# Patient Record
Sex: Male | Born: 1960
Health system: Southern US, Community
[De-identification: ages and names within clinical notes are randomized; demographics above are authoritative.]

## PROBLEM LIST (undated history)

## (undated) DIAGNOSIS — F419 Anxiety disorder, unspecified: Secondary | ICD-10-CM

## (undated) DIAGNOSIS — G4730 Sleep apnea, unspecified: Secondary | ICD-10-CM

## (undated) DIAGNOSIS — I10 Essential (primary) hypertension: Secondary | ICD-10-CM

## (undated) HISTORY — DX: Anxiety disorder, unspecified: F41.9

## (undated) HISTORY — PX: FOOT SURGERY: SHX648

## (undated) HISTORY — DX: Sleep apnea, unspecified: G47.30

## (undated) HISTORY — DX: Essential (primary) hypertension: I10

## (undated) HISTORY — PX: MOUTH SURGERY: SHX715

## (undated) HISTORY — PX: MANDIBLE FRACTURE SURGERY: SHX706

## (undated) HISTORY — PX: ANAL FISSURE REPAIR: SHX2312

---

## 2008-06-07 LAB — HM COLONOSCOPY: HM COLON: NORMAL

## 2009-04-26 ENCOUNTER — Encounter: Payer: Self-pay | Admitting: Cardiovascular Disease

## 2009-04-26 LAB — CONVERTED CEMR LAB
ALT: 48 units/L
BUN: 13 mg/dL
Chloride: 100 meq/L
Eosinophils Relative: 3 %
HDL: 53 mg/dL
Hemoglobin: 14 g/dL
Monocytes Relative: 8 %
RDW: 12.5 %
TSH: 2.7 microintl units/mL
Total Bilirubin: 0.5 mg/dL
Total Protein: 7.6 g/dL
Triglyceride fasting, serum: 107 mg/dL
WBC: 5.7 10*3/uL

## 2009-05-10 ENCOUNTER — Encounter: Payer: Self-pay | Admitting: Cardiovascular Disease

## 2009-05-10 DIAGNOSIS — F329 Major depressive disorder, single episode, unspecified: Secondary | ICD-10-CM | POA: Insufficient documentation

## 2009-05-10 DIAGNOSIS — F3289 Other specified depressive episodes: Secondary | ICD-10-CM | POA: Insufficient documentation

## 2009-05-10 DIAGNOSIS — E785 Hyperlipidemia, unspecified: Secondary | ICD-10-CM | POA: Insufficient documentation

## 2009-05-10 DIAGNOSIS — I10 Essential (primary) hypertension: Secondary | ICD-10-CM | POA: Insufficient documentation

## 2009-05-16 ENCOUNTER — Ambulatory Visit: Payer: Self-pay | Admitting: Cardiovascular Disease

## 2009-05-16 ENCOUNTER — Encounter: Payer: Self-pay | Admitting: Cardiovascular Disease

## 2009-05-17 ENCOUNTER — Encounter: Payer: Self-pay | Admitting: Cardiovascular Disease

## 2009-06-10 ENCOUNTER — Ambulatory Visit: Payer: Self-pay | Admitting: Internal Medicine

## 2009-06-10 ENCOUNTER — Ambulatory Visit: Payer: Self-pay

## 2009-06-10 ENCOUNTER — Encounter (INDEPENDENT_AMBULATORY_CARE_PROVIDER_SITE_OTHER): Payer: Self-pay | Admitting: *Deleted

## 2009-06-10 ENCOUNTER — Ambulatory Visit (HOSPITAL_COMMUNITY): Admission: RE | Admit: 2009-06-10 | Discharge: 2009-06-10 | Payer: Self-pay | Admitting: Cardiovascular Disease

## 2009-06-22 ENCOUNTER — Ambulatory Visit: Payer: Self-pay | Admitting: Cardiovascular Disease

## 2010-03-29 NOTE — Assessment & Plan Note (Signed)
Summary: NP6/AMD   Visit Type:  New Patient Referring Provider:  Julieanne Manson Primary Provider:  Julieanne Manson  CC:  no complaints and to be seen due to family history-dad had first mi around 55ish. .  History of Present Illness: 50 yo WM with history of HTN, hyperlipidemia and anxiety who is referred today for initial cardiac evaluation secondary to strong family history of CAD. He has had no chest pain, SOB, palpitations, fatigue, near syncope, syncope, orthopnea, PND or lower extremity edema. He does not smoke. He exercises 3-4 days per week and plays tennis and basketball.   Old records reviewed. Fasting lipids (04/26/09): TC: 204  HDL: 38  LDL: 131  TG:101.  Preventive Screening-Counseling & Management  Alcohol-Tobacco     Alcohol drinks/day: <1     Smoking Status: never  Caffeine-Diet-Exercise     Caffeine use/day: 2     Does Patient Exercise: yes      Drug Use:  no.    Current Problems (verified): 1)  Depression, Mild  (ICD-311) 2)  Hyperlipidemia-mixed  (ICD-272.4) 3)  Hypertension, Benign  (ICD-401.1)  Current Medications (verified): 1)  Crestor 5 Mg Tabs (Rosuvastatin Calcium) .Marland Kitchen.. 1 By Mouth Four Times A Week 2)  Aspirin 81 Mg Tbec (Aspirin) .... Take One Tablet By Mouth Daily 3)  Vitamin C Cr 500 Mg Cr-Tabs (Ascorbic Acid) .Marland Kitchen.. 1 By Mouth Once Daily 4)  Ergocalciferol 50000 Unit Caps (Ergocalciferol) .Marland Kitchen.. 1 By Mouth Week 5)  Effexor Xr 75 Mg Xr24h-Cap (Venlafaxine Hcl) .Marland Kitchen.. 1po Once Daily 6)  Coq10 100 Mg Caps (Coenzyme Q10) .Marland Kitchen.. 1 By Mouth Once Daily 7)  Fish Oil 1000 Mg Caps (Omega-3 Fatty Acids) .Marland Kitchen.. 1 By Mouth Daily As Thinking About It  Allergies (verified): No Known Drug Allergies  Past History:  Past Medical History: Hyperlipidemia HTN Depression/Anxiety  Past Surgical History: Plastic surgery on jaw-trauma as child Anal fissure repaired. 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  Alcohol Use - yes, social. Regular Exercise - yes Drug Use - no No illicit drug use Alcohol drinks/day:  <1 Smoking Status:  never Caffeine use/day:  2 Does Patient Exercise:  yes Drug Use:  no  Review of Systems  The patient denies fatigue, malaise, fever, weight gain/loss, vision loss, decreased hearing, hoarseness, chest pain, palpitations, shortness of breath, prolonged cough, wheezing, sleep apnea, coughing up blood, abdominal pain, blood in stool, nausea, vomiting, diarrhea, heartburn, incontinence, blood in urine, muscle weakness, joint pain, leg swelling, rash, skin lesions, headache, fainting, dizziness, depression, anxiety, enlarged lymph nodes, easy bruising or bleeding, and environmental allergies.    Vital Signs:  Patient profile:   50 year old male Height:      75 inches Weight:      211.75 pounds BMI:     26.56 Pulse rate:   65 / minute Pulse rhythm:   regular BP sitting:   140 / 90  (left arm) Cuff size:   large  Vitals Entered By: Mercer Pod (May 16, 2009 2:20 PM)  Physical Exam  General:  General: Well developed, well nourished, NAD HEENT: OP clear, mucus membranes moist SKIN: warm, dry Neuro: No focal deficits Musculoskeletal: Muscle strength 5/5 all ext Psychiatric: Mood and affect normal Neck: No JVD, no carotid bruits, no thyromegaly, no  lymphadenopathy. Lungs:Clear bilaterally, no wheezes, rhonci, crackles CV: RRR no murmurs, gallops rubs Abdomen: soft, NT, ND, BS present Extremities: No edema, pulses 2+.    EKG  Procedure date:  04/26/2009  Findings:      Sinus bradycardia, rate 59 bpm.   Impression & Recommendations:  Problem # 1:  FAMILY HISTORY OF ISCHEMIC HEART DISEASE (ICD-V17.3) Mr. Palladino has risk factors for CAD including HTN and hyperlipidemia. He has had no  exertional chest pain or SOB, however, given his strong family history of CAD, he is concerned about the potential for CAD. I will arrange a stress echo for risk stratification. He understands the importance of balanced, low fat diet, as well as the benefit of daily exercise and weight maintenance. He is a lifelong non-smoker.   Orders: Stress Echo (Stress Echo)  Problem # 2:  HYPERTENSION, BENIGN (ICD-401.1) Borderline elevation. He does not wish to start medical therapy at this time. He will continue to follow at home.   His updated medication list for this problem includes:    Aspirin 81 Mg Tbec (Aspirin) .Marland Kitchen... Take one tablet by mouth daily  Problem # 3:  HYPERLIPIDEMIA-MIXED (ICD-272.4) Continue statin per Dr. Sullivan Lone.   His updated medication list for this problem includes:    Crestor 5 Mg Tabs (Rosuvastatin calcium) .Marland Kitchen... 1 by mouth four times a week  Patient Instructions: 1)  Your physician recommends that you schedule a follow-up appointment in: 2-3 weeks 2)  Your physician recommends that you continue on your current medications as directed. Please refer to the Current Medication list given to you today. 3)  Your physician has requested that you have a stress echocardiogram. For further information please visit [email or web address]  Please follow instruction sheet as given.

## 2010-03-29 NOTE — Assessment & Plan Note (Signed)
Summary: ROV/AMD   Visit Type:  Follow-up Referring Provider:  Julieanne Manson Primary Provider:  Julieanne Manson  CC:  Patient do not have any complaint today. Patient is here for a follow up.  History of Present Illness: 50 yo WM with history of HTN, hyperlipidemia and anxiety who is here today for follow up. He was seen last month for  initial cardiac evaluation secondary to strong family history of CAD. He has had no chest pain, SOB, palpitations, fatigue, near syncope, syncope, orthopnea, PND or lower extremity edema. He does not smoke. He exercises 3-4 days per week and plays tennis and basketball.   I ordered a stress echo for screening. His LV function was normal and increased with exercise. No EKG changes or signs of ischemia. Target heart rate was achieved. He is asymptomatic.   Current Medications (verified): 1)  Crestor 5 Mg Tabs (Rosuvastatin Calcium) .Marland Kitchen.. 1 By Mouth Four Times A Week 2)  Aspirin 81 Mg Tbec (Aspirin) .... Take One Tablet By Mouth Daily 3)  Vitamin C Cr 500 Mg Cr-Tabs (Ascorbic Acid) .Marland Kitchen.. 1 By Mouth Once Daily 4)  Ergocalciferol 50000 Unit Caps (Ergocalciferol) .Marland Kitchen.. 1 By Mouth Week 5)  Effexor Xr 75 Mg Xr24h-Cap (Venlafaxine Hcl) .Marland Kitchen.. 1po Once Daily 6)  Coq10 100 Mg Caps (Coenzyme Q10) .Marland Kitchen.. 1 By Mouth Once Daily 7)  Fish Oil 1000 Mg Caps (Omega-3 Fatty Acids) .Marland Kitchen.. 1 By Mouth Daily As Thinking About It  Allergies (verified): No Known Drug Allergies  Past History:  Past Medical History: Reviewed history from 05/16/2009 and no changes required. Hyperlipidemia HTN Depression/Anxiety  Social History: Reviewed history from 05/16/2009 and no changes required. Full Time, delivery manager at [employer] Single, no children Tobacco Use - No.  Alcohol Use - yes, social. Regular Exercise - yes Drug Use - no No illicit drug use  Review of Systems  The patient denies fatigue, malaise, fever, weight gain/loss, vision loss, decreased hearing, hoarseness, chest pain,  palpitations, shortness of breath, prolonged cough, wheezing, sleep apnea, coughing up blood, abdominal pain, blood in stool, nausea, vomiting, diarrhea, heartburn, incontinence, blood in urine, muscle weakness, joint pain, leg swelling, rash, skin lesions, headache, fainting, dizziness, depression, anxiety, enlarged lymph nodes, easy bruising or bleeding, and environmental allergies.    Vital Signs:  Patient profile:   50 year old male Height:      75 inches Weight:      2210.75 pounds Pulse rate:   72 / minute BP sitting:   138 / 70  (left arm) Cuff size:   large  Physical Exam  General:  General: Well developed, well nourished, NAD Psychiatric: Mood and affect normal Neck: No JVD, no carotid bruits, no thyromegaly, no lymphadenopathy. Lungs:Clear bilaterally, no wheezes, rhonci, crackles CV: RRR no murmurs, gallops rubs Abdomen: soft, NT, ND, BS present Extremities: No edema, pulses 2+.    Stress Echocardiogram  Procedure date:  06/10/2009  Findings:      Study Conclusions  - Stress ECG conclusions: The stress ECG was normal. - Staged echo: Normal echo stress  Pt achieved target heart rate. No wall motion abnormalities with exercise. No eKG changes.   EKG  Procedure date:  06/22/2009  Findings:      NSR, Normal ekg  Impression & Recommendations:  Problem # 1:  FAMILY HISTORY OF ISCHEMIC HEART DISEASE (ICD-V17.3) NO evidence of ischemia on stress testing. No further cardiac workup at this time. Follow up as needed.   Patient Instructions: 1)  Your physician recommends that  you schedule a follow-up appointment in: as needed.

## 2010-03-29 NOTE — Progress Notes (Signed)
Summary: PHI  PHI   Imported By: Harlon Flor 05/18/2009 09:41:14  _____________________________________________________________________  External Attachment:    Type:   Image     Comment:   External Document

## 2010-03-29 NOTE — Letter (Signed)
Summary: Outpatient Coinsurance Notice  Outpatient Coinsurance Notice   Imported By: Marylou Mccoy 07/04/2009 11:03:48  _____________________________________________________________________  External Attachment:    Type:   Image     Comment:   External Document

## 2012-05-21 ENCOUNTER — Ambulatory Visit: Payer: Self-pay | Admitting: Family Medicine

## 2012-12-16 ENCOUNTER — Ambulatory Visit (INDEPENDENT_AMBULATORY_CARE_PROVIDER_SITE_OTHER): Payer: BC Managed Care – PPO

## 2012-12-16 ENCOUNTER — Ambulatory Visit (INDEPENDENT_AMBULATORY_CARE_PROVIDER_SITE_OTHER): Payer: BC Managed Care – PPO | Admitting: Podiatry

## 2012-12-16 ENCOUNTER — Encounter: Payer: Self-pay | Admitting: Podiatry

## 2012-12-16 VITALS — BP 119/78 | HR 69 | Resp 16 | Ht 75.0 in | Wt 204.0 lb

## 2012-12-16 DIAGNOSIS — M21619 Bunion of unspecified foot: Secondary | ICD-10-CM

## 2012-12-16 DIAGNOSIS — M2020 Hallux rigidus, unspecified foot: Secondary | ICD-10-CM

## 2012-12-16 DIAGNOSIS — M21612 Bunion of left foot: Secondary | ICD-10-CM

## 2012-12-16 DIAGNOSIS — M779 Enthesopathy, unspecified: Secondary | ICD-10-CM

## 2012-12-16 NOTE — Progress Notes (Signed)
Subjective:     Patient ID: Zachary Hess, male   DOB: 07-04-1960, 52 y.o.   MRN: [id number]  HPI patient presents stating my left foot feels good I am playing tennis and starting to notice some discomfort in my right big toe joint   Review of Systems  All other systems reviewed and are negative.       Objective:   Physical Exam  Constitutional: He appears well-developed and well-nourished.  Cardiovascular: Intact distal pulses.   Musculoskeletal: Normal range of motion.  Neurological: He is alert.  Skin: Skin is warm.   patient has reduced range of motion first MPJ right with mild crepitus upon dorsiflexion of the hallux and a well healed surgical site left first metatarsal     Assessment:     Implant surgery left doing beautifully with hallux limitus with mild capsulitis first right MPJ    Plan:     Reviewed x-rays of left foot and discussed correction of the right foot. I am hopeful that we can catch it early we could reconstruct it versus destruct and I explained to him the differences patient may resume normal activity for left foot at the current time

## 2014-03-05 ENCOUNTER — Encounter: Payer: Self-pay | Admitting: Cardiovascular Disease

## 2014-05-31 LAB — LIPID PANEL
CHOLESTEROL: 179 mg/dL (ref 0–200)
HDL: 57 mg/dL (ref 35–70)
LDL Cholesterol: 96 mg/dL
LDL/HDL RATIO: 1.7
TRIGLYCERIDES: 131 mg/dL (ref 40–160)

## 2014-05-31 LAB — CBC AND DIFFERENTIAL
HEMATOCRIT: 39 % — AB (ref 41–53)
HEMOGLOBIN: 13.3 g/dL — AB (ref 13.5–17.5)
NEUTROS ABS: 3 /uL
Platelets: 235 10*3/uL (ref 150–399)
WBC: 5.2 10^3/mL

## 2014-05-31 LAB — BASIC METABOLIC PANEL
BUN: 18 mg/dL (ref 4–21)
Creatinine: 0.9 mg/dL (ref 0.6–1.3)
Glucose: 103 mg/dL
POTASSIUM: 4.5 mmol/L (ref 3.4–5.3)
SODIUM: 139 mmol/L (ref 137–147)

## 2014-05-31 LAB — HEPATIC FUNCTION PANEL
ALT: 31 U/L (ref 10–40)
AST: 28 U/L (ref 14–40)
Alkaline Phosphatase: 73 U/L (ref 25–125)

## 2014-05-31 LAB — TSH: TSH: 4.13 u[IU]/mL (ref 0.41–5.90)

## 2014-11-29 ENCOUNTER — Encounter: Payer: Self-pay | Admitting: Family Medicine

## 2014-11-29 ENCOUNTER — Ambulatory Visit (INDEPENDENT_AMBULATORY_CARE_PROVIDER_SITE_OTHER): Payer: BLUE CROSS/BLUE SHIELD | Admitting: Family Medicine

## 2014-11-29 VITALS — BP 138/82 | HR 64 | Temp 98.0°F | Resp 12 | Ht 74.5 in | Wt 211.0 lb

## 2014-11-29 DIAGNOSIS — Z23 Encounter for immunization: Secondary | ICD-10-CM | POA: Diagnosis not present

## 2014-11-29 DIAGNOSIS — Z6826 Body mass index (BMI) 26.0-26.9, adult: Secondary | ICD-10-CM | POA: Insufficient documentation

## 2014-11-29 DIAGNOSIS — I10 Essential (primary) hypertension: Secondary | ICD-10-CM | POA: Insufficient documentation

## 2014-11-29 DIAGNOSIS — E559 Vitamin D deficiency, unspecified: Secondary | ICD-10-CM | POA: Insufficient documentation

## 2014-11-29 DIAGNOSIS — J302 Other seasonal allergic rhinitis: Secondary | ICD-10-CM | POA: Insufficient documentation

## 2014-11-29 DIAGNOSIS — F4320 Adjustment disorder, unspecified: Secondary | ICD-10-CM | POA: Insufficient documentation

## 2014-11-29 DIAGNOSIS — F329 Major depressive disorder, single episode, unspecified: Secondary | ICD-10-CM | POA: Insufficient documentation

## 2014-11-29 DIAGNOSIS — F32A Depression, unspecified: Secondary | ICD-10-CM | POA: Insufficient documentation

## 2014-11-29 DIAGNOSIS — F320 Major depressive disorder, single episode, mild: Secondary | ICD-10-CM | POA: Insufficient documentation

## 2014-11-29 DIAGNOSIS — E785 Hyperlipidemia, unspecified: Secondary | ICD-10-CM | POA: Insufficient documentation

## 2014-11-29 NOTE — Progress Notes (Signed)
Patient ID: Zachary Hess, male   DOB: 13-May-1960, 54 y.o.   MRN: [id number]    Subjective:  HPI  Hypertension follow up: Patient is here for 6 months follow up. Patient is tolerating medication well. He checks his B/P sometimes and readings are around 124/78. BP Readings from Last 3 Encounters:  11/29/14 138/82  05/31/14 118/76  12/16/12 119/78   Depression follow up: Patient states he is doing ok. He is taking Effexor 37.5 mg.  PHQ9 today is 2.  Hyperlipidemia follow up: Patient is on Lipitor for this. Patient is tolerating it well. Lab Results  Component Value Date   CHOL 179 05/31/2014   CHOL 204 04/26/2009   Lab Results  Component Value Date   HDL 57 05/31/2014   HDL 53 04/26/2009   Lab Results  Component Value Date   LDLCALC 96 05/31/2014   LDLCALC 131 04/26/2009   Lab Results  Component Value Date   TRIG 131 05/31/2014   TRIG 107 04/26/2009       Prior to Admission medications   Medication Sig Start Date End Date Taking? Authorizing Provider  ALPRAZolam Duanne Moron) 0.5 MG tablet Take by mouth. 02/23/13   Historical Provider, MD  ascorbic acid (VITAMIN C) 500 MG tablet Take by mouth. 05/11/10   Historical Provider, MD  aspirin 81 MG tablet Take by mouth.    Historical Provider, MD  atorvastatin (LIPITOR) 10 MG tablet Take by mouth. 06/24/14   Historical Provider, MD  atorvastatin (LIPITOR) 10 MG tablet  11/07/14   Historical Provider, MD  Cholecalciferol 1000 UNITS tablet Take by mouth.    Historical Provider, MD  Coenzyme Q10 (COQ10) 100 MG CAPS Take by mouth. 05/11/10   Historical Provider, MD  fluticasone (FLONASE) 50 MCG/ACT nasal spray Place into the nose.    Historical Provider, MD  Loratadine (CLARITIN PO) Take by mouth daily.    Historical Provider, MD  losartan (COZAAR) 50 MG tablet Take 50 mg by mouth daily.    Historical Provider, MD  Lysine HCl 1000 MG TABS Take by mouth. 05/11/10   Historical Provider, MD  venlafaxine (EFFEXOR) 37.5 MG tablet Take 37.5  mg by mouth daily.    Historical Provider, MD    Patient Active Problem List   Diagnosis Date Noted  . Adaptation reaction 11/29/2014  . Body mass index (BMI) of 26.0-26.9 in adult 11/29/2014  . Essential (primary) hypertension 11/29/2014  . HLD (hyperlipidemia) 11/29/2014  . Mild depression 11/29/2014  . Clinical depression 11/29/2014  . Allergic rhinitis, seasonal 11/29/2014  . Avitaminosis D 11/29/2014  . HYPERLIPIDEMIA-MIXED 05/10/2009  . DEPRESSION, MILD 05/10/2009  . HYPERTENSION, BENIGN 05/10/2009    Past Medical History  Diagnosis Date  . HBP (high blood pressure)   . Anxiety     Social History   Social History  . Marital Status: Married    Spouse Name: N/A  . Number of Children: N/A  . Years of Education: N/A   Occupational History  . Not on file.   Social History Main Topics  . Smoking status: Never Smoker   . Smokeless tobacco: Never Used  . Alcohol Use: Yes     Comment: SOCIALLY  . Drug Use: No  . Sexual Activity: Not on file   Other Topics Concern  . Not on file   Social History Narrative    No Known Allergies  Review of Systems  Constitutional: Negative.   Respiratory: Negative.   Cardiovascular: Negative.   Gastrointestinal: Negative.   Musculoskeletal: Negative.  Neurological: Negative.   Endo/Heme/Allergies: Negative.   Psychiatric/Behavioral: Negative.     Immunization History  Administered Date(s) Administered  . Hepatitis A 01/24/2011, 03/13/2011, 07/24/2011  . Hepatitis B 01/24/2011, 03/13/2011, 07/24/2011  . Td 10/04/2003  . Tdap 11/25/2013  . Typhoid Inactivated 03/11/2011   Objective:  BP 138/82 mmHg  Pulse 64  Temp(Src) 98 F (36.7 C)  Resp 12  Ht 6' 2.5" (1.892 m)  Wt 211 lb (95.709 kg)  BMI 26.74 kg/m2  Physical Exam  Constitutional: He is oriented to person, place, and time and well-developed, well-nourished, and in no distress.  HENT:  Head: Normocephalic and atraumatic.  Right Ear: External ear normal.    Left Ear: External ear normal.  Nose: Nose normal.  Eyes: Conjunctivae are normal.  Neck: Neck supple.  Cardiovascular: Normal rate, regular rhythm and normal heart sounds.   Pulmonary/Chest: Effort normal and breath sounds normal.  Abdominal: Soft.  Neurological: He is alert and oriented to person, place, and time.  Skin: Skin is warm and dry.  Psychiatric: Mood, memory, affect and judgment normal.    Lab Results  Component Value Date   WBC 5.2 05/31/2014   HGB 13.3* 05/31/2014   HCT 39* 05/31/2014   PLT 235 05/31/2014   GLUCOSE 96 04/26/2009   CHOL 179 05/31/2014   TRIG 131 05/31/2014   HDL 57 05/31/2014   LDLCALC 96 05/31/2014   TSH 4.13 05/31/2014    CMP     Component Value Date/Time   NA 139 05/31/2014   NA 139 04/26/2009   K 4.5 05/31/2014   CL 100 04/26/2009   CO2 28 04/26/2009   GLUCOSE 96 04/26/2009   BUN 18 05/31/2014   BUN 13 04/26/2009   CREATININE 0.9 05/31/2014   CREATININE 1.03 04/26/2009   CALCIUM 9.6 04/26/2009   PROT 7.6 04/26/2009   AST 28 05/31/2014   ALT 31 05/31/2014   ALKPHOS 73 05/31/2014   BILITOT 0.5 04/26/2009    Assessment and Plan :  1. Need for influenza vaccination  - Flu Vaccine QUAD 36+ mos IM 2. Hypertension 3. Hyperlipidemia 4. Depression Plan to wean venlafaxine next spring  [facility] 11/29/2014 2:34 PM

## 2015-03-02 ENCOUNTER — Other Ambulatory Visit: Payer: Self-pay | Admitting: Family Medicine

## 2015-05-02 ENCOUNTER — Other Ambulatory Visit: Payer: Self-pay | Admitting: Family Medicine

## 2015-06-02 ENCOUNTER — Ambulatory Visit (INDEPENDENT_AMBULATORY_CARE_PROVIDER_SITE_OTHER): Payer: BLUE CROSS/BLUE SHIELD | Admitting: Family Medicine

## 2015-06-02 ENCOUNTER — Telehealth: Payer: Self-pay | Admitting: Family Medicine

## 2015-06-02 ENCOUNTER — Other Ambulatory Visit: Payer: Self-pay | Admitting: Family Medicine

## 2015-06-02 VITALS — BP 114/72 | HR 60 | Temp 98.0°F | Resp 16 | Ht 74.5 in | Wt 213.0 lb

## 2015-06-02 DIAGNOSIS — Z0000 Encounter for general adult medical examination without abnormal findings: Secondary | ICD-10-CM | POA: Diagnosis not present

## 2015-06-02 DIAGNOSIS — Z1211 Encounter for screening for malignant neoplasm of colon: Secondary | ICD-10-CM | POA: Diagnosis not present

## 2015-06-02 DIAGNOSIS — E785 Hyperlipidemia, unspecified: Secondary | ICD-10-CM

## 2015-06-02 DIAGNOSIS — I10 Essential (primary) hypertension: Secondary | ICD-10-CM | POA: Diagnosis not present

## 2015-06-02 DIAGNOSIS — Z125 Encounter for screening for malignant neoplasm of prostate: Secondary | ICD-10-CM | POA: Diagnosis not present

## 2015-06-02 LAB — POCT URINALYSIS DIPSTICK
Bilirubin, UA: NEGATIVE
Blood, UA: NEGATIVE
Glucose, UA: NEGATIVE
KETONES UA: NEGATIVE
LEUKOCYTES UA: NEGATIVE
NITRITE UA: NEGATIVE
PROTEIN UA: NEGATIVE
Spec Grav, UA: <=1.005
Urobilinogen, UA: NEGATIVE
pH, UA: 5

## 2015-06-02 LAB — IFOBT (OCCULT BLOOD): IFOBT: NEGATIVE

## 2015-06-02 NOTE — Telephone Encounter (Signed)
Cardiac risk stratification, hyperlipidemia, hypertension

## 2015-06-02 NOTE — Telephone Encounter (Signed)
Per [facility] test for cardiac scoring should be [id number]

## 2015-06-02 NOTE — Telephone Encounter (Signed)
What diagnoses are we using for the test?-aa

## 2015-06-02 NOTE — Telephone Encounter (Signed)
Ok thx.

## 2015-06-02 NOTE — Telephone Encounter (Signed)
Done-aa 

## 2015-06-02 NOTE — Progress Notes (Signed)
Patient ID: Zachary Hess, male   DOB: 21-Oct-1960, 55 y.o.   MRN: [id number] Patient: Zachary Hess, Male    DOB: 1960/04/02, 55 y.o.   MRN: [id number] Visit Date: 06/02/2015  Today's Provider: [name], MD   Chief Complaint  Patient presents with  . Annual Exam   Subjective:  Zachary Hess is a 55 y.o. male who presents today for health maintenance and complete physical. He feels well. He reports exercising 4 days per week. He reports he is sleeping well.  Immunization History  Administered Date(s) Administered  . Hepatitis A 01/24/2011, 03/13/2011, 07/24/2011  . Hepatitis B 01/24/2011, 03/13/2011, 07/24/2011  . Influenza,inj,Quad PF,36+ Mos 11/29/2014  . Td 10/04/2003  . Tdap 11/25/2013  . Typhoid Inactivated 03/11/2011   06/07/2008 Colonoscopy   Review of Systems  Constitutional: Positive for fatigue.  HENT: Positive for sneezing.   Eyes: Negative.   Respiratory: Negative.   Cardiovascular: Negative.   Gastrointestinal: Negative.   Endocrine: Negative.   Genitourinary: Negative.   Musculoskeletal: Positive for arthralgias.  Skin: Negative.   Allergic/Immunologic: Positive for environmental allergies.  Neurological: Negative.   Hematological: Negative.   Psychiatric/Behavioral: Positive for decreased concentration.    Social History   Social History  . Marital Status: Married    Spouse Name: N/A  . Number of Children: N/A  . Years of Education: N/A   Occupational History  . Not on file.   Social History Main Topics  . Smoking status: Never Smoker   . Smokeless tobacco: Never Used  . Alcohol Use: Yes     Comment: SOCIALLY  . Drug Use: No  . Sexual Activity: Not on file   Other Topics Concern  . Not on file   Social History Narrative    Patient Active Problem List   Diagnosis Date Noted  . Adaptation reaction 11/29/2014  . Body mass index (BMI) of 26.0-26.9 in adult 11/29/2014  . Essential (primary) hypertension 11/29/2014  . HLD  (hyperlipidemia) 11/29/2014  . Mild depression 11/29/2014  . Clinical depression 11/29/2014  . Allergic rhinitis, seasonal 11/29/2014  . Avitaminosis D 11/29/2014  . HYPERLIPIDEMIA-MIXED 05/10/2009  . DEPRESSION, MILD 05/10/2009  . HYPERTENSION, BENIGN 05/10/2009    Past Surgical History  Procedure Laterality Date  . Foot surgery Left   . Mandible fracture surgery    . Anal fissure repair    . Mouth surgery      WISDOM TEETH    His family history includes Atrial fibrillation in his mother; CAD in his father; Congestive Heart Failure in his father; Diabetes in his mother; Heart attack in his father and paternal uncle; Heart disease in his maternal grandmother; Stroke in his paternal grandfather.    Outpatient Prescriptions Prior to Visit  Medication Sig Dispense Refill  . aspirin 81 MG tablet Take by mouth.    Marland Kitchen atorvastatin (LIPITOR) 10 MG tablet     . Cholecalciferol 1000 UNITS tablet Take by mouth.    . Coenzyme Q10 (COQ10) 100 MG CAPS Take by mouth.    . fluticasone (FLONASE) 50 MCG/ACT nasal spray Place into the nose.    . Loratadine (CLARITIN PO) Take by mouth daily.    Marland Kitchen losartan (COZAAR) 50 MG tablet TAKE 1 TABLET DAILY 90 tablet 3  . Lysine HCl 1000 MG TABS Take by mouth.    . venlafaxine XR (EFFEXOR-XR) 37.5 MG 24 hr capsule TAKE 1 CAPSULE DAILY 90 capsule 3  . venlafaxine (EFFEXOR) 37.5 MG tablet Take 37.5 mg by  mouth daily.     No facility-administered medications prior to visit.    Patient Care Team: Jerrol Banana., MD as PCP - General (Family Medicine)     Objective:   Vitals:  Filed Vitals:   06/02/15 0926  BP: 114/72  Pulse: 60  Temp: 98 F (36.7 C)  TempSrc: Oral  Resp: 16  Height: 6' 2.5" (1.892 m)  Weight: 213 lb (96.616 kg)    Physical Exam  Constitutional: He is oriented to person, place, and time. He appears well-developed and well-nourished.  HENT:  Head: Normocephalic and atraumatic.  Right Ear: External ear normal.  Left Ear:  External ear normal.  Nose: Nose normal.  Mouth/Throat: Oropharynx is clear and moist.  Eyes: Conjunctivae and EOM are normal. Pupils are equal, round, and reactive to light.  Neck: Normal range of motion. Neck supple.  Cardiovascular: Normal rate, regular rhythm, normal heart sounds and intact distal pulses.   Pulmonary/Chest: Effort normal and breath sounds normal.  Abdominal: Soft. Bowel sounds are normal.  Genitourinary: Rectum normal, prostate normal and penis normal.  Musculoskeletal: Normal range of motion.  Neurological: He is alert and oriented to person, place, and time.  Skin: Skin is warm and dry.  Psychiatric: He has a normal mood and affect. His behavior is normal. Judgment and thought content normal.     Depression Screen PHQ 2/9 Scores 11/29/2014  PHQ - 2 Score 0  PHQ- 9 Score 2      Assessment & Plan:     Routine Health Maintenance and Physical Exam  Exercise Activities and Dietary recommendations Goals    None      Immunization History  Administered Date(s) Administered  . Hepatitis A 01/24/2011, 03/13/2011, 07/24/2011  . Hepatitis B 01/24/2011, 03/13/2011, 07/24/2011  . Influenza,inj,Quad PF,36+ Mos 11/29/2014  . Td 10/04/2003  . Tdap 11/25/2013  . Typhoid Inactivated 03/11/2011    Health Maintenance  Topic Date Due  . Hepatitis C Screening  12-31-1960  . HIV Screening  05/27/1973  . INFLUENZA VACCINE  09/27/2015  . COLONOSCOPY  06/08/2018  . TETANUS/TDAP  11/26/2023    Overall good health. Current health issues are controlled.  Discussed health benefits of physical activity, and encouraged him to engage in regular exercise appropriate for his age and condition.              Patient will like to obtain HER past CT for calcium score of his coronary arteries. He would like this for cardiac risk assessment.this is completely reasonable and we'll order this test. He understands that this is not covered by insurance historically. I have done the  exam and reviewed the above chart and it is accurate to the best of my knowledge.  ------------------------------------------------------------------------------------------------------------

## 2015-06-02 NOTE — Telephone Encounter (Signed)
What diagnoses?-aa

## 2015-06-06 LAB — COMPREHENSIVE METABOLIC PANEL
A/G RATIO: 1.6 (ref 1.2–2.2)
ALBUMIN: 4.6 g/dL (ref 3.5–5.5)
ALK PHOS: 67 IU/L (ref 39–117)
ALT: 28 IU/L (ref 0–44)
AST: 28 IU/L (ref 0–40)
BUN / CREAT RATIO: 14 (ref 9–20)
BUN: 13 mg/dL (ref 6–24)
Bilirubin Total: 0.7 mg/dL (ref 0.0–1.2)
CO2: 26 mmol/L (ref 18–29)
CREATININE: 0.96 mg/dL (ref 0.76–1.27)
Calcium: 10 mg/dL (ref 8.7–10.2)
Chloride: 96 mmol/L (ref 96–106)
GFR calc Af Amer: 101 mL/min/{1.73_m2} (ref >59)
GFR calc non Af Amer: 87 mL/min/{1.73_m2} (ref >59)
GLOBULIN, TOTAL: 2.8 g/dL (ref 1.5–4.5)
Glucose: 92 mg/dL (ref 65–99)
Potassium: 4.6 mmol/L (ref 3.5–5.2)
SODIUM: 138 mmol/L (ref 134–144)
Total Protein: 7.4 g/dL (ref 6.0–8.5)

## 2015-06-06 LAB — LIPID PANEL WITH LDL/HDL RATIO
CHOLESTEROL TOTAL: 174 mg/dL (ref 100–199)
HDL: 56 mg/dL (ref >39)
LDL CALC: 94 mg/dL (ref 0–99)
LDL/HDL RATIO: 1.7 ratio (ref 0.0–3.6)
Triglycerides: 122 mg/dL (ref 0–149)
VLDL Cholesterol Cal: 24 mg/dL (ref 5–40)

## 2015-06-06 LAB — PSA: PROSTATE SPECIFIC AG, SERUM: 1.5 ng/mL (ref 0.0–4.0)

## 2015-06-06 LAB — TSH: TSH: 3.8 u[IU]/mL (ref 0.450–4.500)

## 2015-06-07 ENCOUNTER — Telehealth: Payer: Self-pay | Admitting: Family Medicine

## 2015-06-07 NOTE — Telephone Encounter (Signed)
Please review when you come back-aa

## 2015-06-07 NOTE — Telephone Encounter (Signed)
Insurance company would like peer to peer to get CT scoring approved.Phone [phone]

## 2015-06-08 ENCOUNTER — Encounter: Payer: BLUE CROSS/BLUE SHIELD | Admitting: Family Medicine

## 2015-06-08 ENCOUNTER — Ambulatory Visit (HOSPITAL_COMMUNITY)
Admission: RE | Admit: 2015-06-08 | Discharge: 2015-06-08 | Disposition: A | Discharge status: Home / Self Care | Payer: BLUE CROSS/BLUE SHIELD | Source: Ambulatory Visit | Attending: Family Medicine | Admitting: Family Medicine

## 2015-06-08 DIAGNOSIS — I10 Essential (primary) hypertension: Secondary | ICD-10-CM

## 2015-06-08 DIAGNOSIS — E785 Hyperlipidemia, unspecified: Secondary | ICD-10-CM

## 2015-06-08 LAB — CBC WITH DIFFERENTIAL/PLATELET

## 2015-06-08 NOTE — Progress Notes (Signed)
Procedure cancelled due to scheduling error. Darcel Bayley spoke with pt, will be rescheduled for Monday 9a.

## 2015-06-13 ENCOUNTER — Encounter (HOSPITAL_COMMUNITY): Payer: Self-pay

## 2015-06-13 ENCOUNTER — Ambulatory Visit (HOSPITAL_COMMUNITY)
Admission: RE | Admit: 2015-06-13 | Discharge: 2015-06-13 | Disposition: A | Discharge status: Home / Self Care | Payer: BLUE CROSS/BLUE SHIELD | Source: Ambulatory Visit | Attending: Family Medicine | Admitting: Family Medicine

## 2015-06-13 DIAGNOSIS — I2510 Atherosclerotic heart disease of native coronary artery without angina pectoris: Secondary | ICD-10-CM | POA: Diagnosis not present

## 2015-06-13 DIAGNOSIS — E785 Hyperlipidemia, unspecified: Secondary | ICD-10-CM | POA: Insufficient documentation

## 2015-06-13 DIAGNOSIS — I10 Essential (primary) hypertension: Secondary | ICD-10-CM | POA: Insufficient documentation

## 2015-06-13 MED ORDER — NITROGLYCERIN 0.4 MG SL SUBL
SUBLINGUAL_TABLET | SUBLINGUAL | Status: AC
  Filled 2015-06-13: qty 1

## 2015-06-13 MED ORDER — IOPAMIDOL (ISOVUE-370) INJECTION 76%
80.0000 mL | Freq: Once | INTRAVENOUS | Status: AC | PRN
  Administered 2015-06-13: 80 mL via INTRAVENOUS

## 2015-06-13 MED ORDER — NITROGLYCERIN 0.4 MG SL SUBL
0.4000 mg | SUBLINGUAL_TABLET | Freq: Once | SUBLINGUAL | Status: AC
  Administered 2015-06-13: 0.4 mg via SUBLINGUAL

## 2015-06-13 MED ORDER — METOPROLOL TARTRATE 50 MG PO TABS
50.0000 mg | ORAL_TABLET | Freq: Once | ORAL | Status: AC
  Administered 2015-06-13: 50 mg via ORAL

## 2015-06-13 MED ORDER — METOPROLOL TARTRATE 50 MG PO TABS
ORAL_TABLET | ORAL | Status: AC
  Filled 2015-06-13: qty 1

## 2015-06-13 NOTE — Progress Notes (Signed)
CT completed. Tolerated well. D/C home walking. Wake and alert.

## 2015-09-08 ENCOUNTER — Other Ambulatory Visit: Payer: Self-pay

## 2015-09-08 MED ORDER — ATORVASTATIN CALCIUM 10 MG PO TABS: 10.0000 mg | ORAL_TABLET | Freq: Every day | ORAL | Status: DC

## 2015-12-13 ENCOUNTER — Encounter: Payer: Self-pay | Admitting: Family Medicine

## 2015-12-13 ENCOUNTER — Ambulatory Visit (INDEPENDENT_AMBULATORY_CARE_PROVIDER_SITE_OTHER): Payer: BLUE CROSS/BLUE SHIELD | Admitting: Family Medicine

## 2015-12-13 VITALS — BP 114/82 | HR 64 | Temp 98.3°F | Resp 14 | Wt 216.0 lb

## 2015-12-13 DIAGNOSIS — Z23 Encounter for immunization: Secondary | ICD-10-CM | POA: Diagnosis not present

## 2015-12-13 DIAGNOSIS — I708 Atherosclerosis of other arteries: Secondary | ICD-10-CM

## 2015-12-13 DIAGNOSIS — E784 Other hyperlipidemia: Secondary | ICD-10-CM

## 2015-12-13 DIAGNOSIS — F32A Depression, unspecified: Secondary | ICD-10-CM

## 2015-12-13 DIAGNOSIS — R0681 Apnea, not elsewhere classified: Secondary | ICD-10-CM

## 2015-12-13 DIAGNOSIS — F320 Major depressive disorder, single episode, mild: Secondary | ICD-10-CM

## 2015-12-13 DIAGNOSIS — I10 Essential (primary) hypertension: Secondary | ICD-10-CM

## 2015-12-13 DIAGNOSIS — I7090 Unspecified atherosclerosis: Secondary | ICD-10-CM

## 2015-12-13 DIAGNOSIS — L258 Unspecified contact dermatitis due to other agents: Secondary | ICD-10-CM

## 2015-12-13 DIAGNOSIS — E7849 Other hyperlipidemia: Secondary | ICD-10-CM

## 2015-12-13 MED ORDER — CLOBETASOL PROPIONATE 0.05 % EX CREA
TOPICAL_CREAM | CUTANEOUS | 0 refills | Status: DC
Start: 2015-12-13 — End: 2016-07-02

## 2015-12-13 MED ORDER — ATORVASTATIN CALCIUM 40 MG PO TABS: 40.0000 mg | ORAL_TABLET | Freq: Every day | ORAL | 3 refills | Status: DC

## 2015-12-13 NOTE — Progress Notes (Signed)
Zachary Hess  MRN: [id number] DOB: 1960-06-18  Subjective:  HPI  Patient is here for 6 months follow up Hypertension: patient checks b/p sometimes, last reading was around 130/78. No cardiac symptoms. BP Readings from Last 3 Encounters:  12/13/15 114/82  06/13/15 121/79  06/08/15 (P) 129/76   Depression: He is taking Effexor 37.5 mg and feels his symptoms are stable on this dose.  Patient would like to discuss rash that he has been getting use to be off and on but more consistent lately. Rash is red, itching present. Rash located on both arms, hands groin area. He has tried over the counter anti-itch cream.  Also he is having issues with snoring still and his wife states she felt he may have stopped breathing some at night, he mentioned this issue before and wanted to re address it. He does feel groggy in the morning. Epworth score today is 5. Patient Active Problem List   Diagnosis Date Noted  . Adaptation reaction 11/29/2014  . Body mass index (BMI) of 26.0-26.9 in adult 11/29/2014  . Essential (primary) hypertension 11/29/2014  . HLD (hyperlipidemia) 11/29/2014  . Mild depression ([place]) 11/29/2014  . Clinical depression 11/29/2014  . Allergic rhinitis, seasonal 11/29/2014  . Avitaminosis D 11/29/2014  . HYPERLIPIDEMIA-MIXED 05/10/2009  . DEPRESSION, MILD 05/10/2009  . HYPERTENSION, BENIGN 05/10/2009    Past Medical History:  Diagnosis Date  . Anxiety   . HBP (high blood pressure)     Social History   Social History  . Marital status: Married    Spouse name: N/A  . Number of children: N/A  . Years of education: N/A   Occupational History  . Not on file.   Social History Main Topics  . Smoking status: Never Smoker  . Smokeless tobacco: Never Used  . Alcohol use Yes     Comment: SOCIALLY  . Drug use: No  . Sexual activity: Not on file   Other Topics Concern  . Not on file   Social History Narrative  . No narrative on file    Outpatient Encounter  Prescriptions as of 12/13/2015  Medication Sig Note  . aspirin 81 MG tablet Take by mouth. 11/29/2014: Received from: [facility]  . atorvastatin (LIPITOR) 10 MG tablet Take 1 tablet (10 mg total) by mouth at bedtime.   . Cholecalciferol 1000 UNITS tablet Take by mouth. 11/29/2014: Received from: [facility]  . Coenzyme Q10 (COQ10) 100 MG CAPS Take by mouth. 11/29/2014: Received from: [facility]  . fluticasone (FLONASE) 50 MCG/ACT nasal spray Place into the nose. 11/29/2014: Medication taken as needed.  Received from: [facility]  . Loratadine (CLARITIN PO) Take by mouth daily.   Marland Kitchen losartan (COZAAR) 50 MG tablet TAKE 1 TABLET DAILY   . Lysine HCl 1000 MG TABS Take by mouth. 11/29/2014: Received from: [facility]  . venlafaxine XR (EFFEXOR-XR) 37.5 MG 24 hr capsule TAKE 1 CAPSULE DAILY    No facility-administered encounter medications on file as of 12/13/2015.     No Known Allergies  Review of Systems  Constitutional: Positive for malaise/fatigue.  Eyes: Negative.   Respiratory: Negative.        Snoring, apnea  Cardiovascular: Negative.   Gastrointestinal: Negative.   Genitourinary: Negative.   Musculoskeletal: Negative.   Skin: Positive for itching and rash.  Neurological: Negative.   Endo/Heme/Allergies: Negative.   Psychiatric/Behavioral: Negative.    Objective:  BP 114/82   Pulse 64   Temp 98.3  F (36.8 C)   Resp 14   Wt 216 lb (98 kg)   BMI 27.36 kg/m   Physical Exam  Constitutional: He is oriented to person, place, and time and well-developed, well-nourished, and in no distress.  HENT:  Head: Normocephalic and atraumatic.  Right Ear: External ear normal.  Left Ear: External ear normal.  Nose: Nose normal.  Eyes: Conjunctivae are normal. Pupils are equal, round, and reactive to light.  Neck: Normal range of motion. Neck supple.  Cardiovascular: Normal rate, regular rhythm,  normal heart sounds and intact distal pulses.   No murmur heard. Pulmonary/Chest: Effort normal and breath sounds normal. No respiratory distress. He has no wheezes.  Abdominal: Soft.  Musculoskeletal: He exhibits no edema or tenderness.  Neurological: He is alert and oriented to person, place, and time. Gait normal. GCS score is 15.  Skin: Skin is warm and dry.  Psychiatric: Mood, memory, affect and judgment normal.    Assessment and Plan :  1. Essential (primary) hypertension Stable. Continue current medication.  2. Other hyperlipidemia Will increase dose to 40 mg for protection due to mild heart disease that patient has. Check labs on the next visit. Add Fish oil 1 tablet daily. - atorvastatin (LIPITOR) 40 MG tablet; Take 1 tablet (40 mg total) by mouth at bedtime.  Dispense: 90 tablet; Refill: 3  3. Mild depression (HCC) Stable on current medication.  4. Contact dermatitis due to other agent, unspecified contact dermatitis type Treat with Clobetasol cream. If this does not improve with the treatment may need to refer to dermatology.  5. Need for influenza vaccination - Flu Vaccine QUAD 36+ mos PF IM (Fluarix & Fluzone Quad PF)  6. Atherosclerosis of artery Discussed risk factors, discussed this in detail today.  7. Apnea Possibly has sleep apnea but score of Epworth is 5-low today. Will set this up. - Ambulatory referral to Sleep Studies  HPI, Exam and A&P transcribed under direction and in the presence of Miguel Aschoff, MD.

## 2015-12-13 NOTE — Patient Instructions (Signed)
Add Fish oil 1 tablet daily.

## 2015-12-23 ENCOUNTER — Telehealth: Payer: Self-pay | Admitting: Family Medicine

## 2015-12-23 NOTE — Telephone Encounter (Deleted)
Pt is went out of town and forgot his medication.  Pt is asking if we can send 2 days of medication to [facility] [place], [place].  [id number]  venlafaxine XR (EFFEXOR-XR) 37.5 MG 24 hr capsule-1 pill a day  atorvastatin (LIPITOR) 40 MG tablet-1 pill a day  losartan (COZAAR) 50 MG tablet-1 pill a day

## 2015-12-23 NOTE — Telephone Encounter (Signed)
Ok to send in medication into the pharmacy? If so, please provide the quantity and I will send it in. Thanks!

## 2015-12-23 NOTE — Telephone Encounter (Signed)
OK to call in quantity of 2 losartan 50mg 

## 2015-12-23 NOTE — Telephone Encounter (Signed)
error 

## 2015-12-23 NOTE — Telephone Encounter (Signed)
[facility] states pt forgot his medication at home.  [facility] losartan (COZAAR) 50 MG tablet for 2 days. [place].  [id number]  This is a Dr Rosanna Randy Pt/MW

## 2015-12-23 NOTE — Telephone Encounter (Signed)
Pharmacy was notified.

## 2016-01-27 ENCOUNTER — Other Ambulatory Visit: Payer: Self-pay

## 2016-01-27 MED ORDER — VENLAFAXINE HCL ER 37.5 MG PO CP24: 37.5000 mg | ORAL_CAPSULE | Freq: Every day | ORAL | 3 refills | Status: DC

## 2016-02-23 ENCOUNTER — Other Ambulatory Visit: Payer: Self-pay | Admitting: Family Medicine

## 2016-02-23 NOTE — Telephone Encounter (Signed)
Last ov 12/13/15 last filled 03/02/15. Please review. Thank you. sd

## 2016-02-28 DIAGNOSIS — L298 Other pruritus: Secondary | ICD-10-CM | POA: Diagnosis not present

## 2016-03-21 ENCOUNTER — Ambulatory Visit (INDEPENDENT_AMBULATORY_CARE_PROVIDER_SITE_OTHER): Payer: BLUE CROSS/BLUE SHIELD | Admitting: Family Medicine

## 2016-03-21 VITALS — BP 134/82 | HR 66 | Temp 98.2°F | Resp 14 | Wt 220.0 lb

## 2016-03-21 DIAGNOSIS — B9789 Other viral agents as the cause of diseases classified elsewhere: Secondary | ICD-10-CM | POA: Diagnosis not present

## 2016-03-21 DIAGNOSIS — E784 Other hyperlipidemia: Secondary | ICD-10-CM | POA: Diagnosis not present

## 2016-03-21 DIAGNOSIS — E7849 Other hyperlipidemia: Secondary | ICD-10-CM

## 2016-03-21 DIAGNOSIS — M545 Low back pain, unspecified: Secondary | ICD-10-CM

## 2016-03-21 DIAGNOSIS — F32A Depression, unspecified: Secondary | ICD-10-CM

## 2016-03-21 DIAGNOSIS — J069 Acute upper respiratory infection, unspecified: Secondary | ICD-10-CM | POA: Diagnosis not present

## 2016-03-21 DIAGNOSIS — I10 Essential (primary) hypertension: Secondary | ICD-10-CM | POA: Diagnosis not present

## 2016-03-21 DIAGNOSIS — F320 Major depressive disorder, single episode, mild: Secondary | ICD-10-CM | POA: Diagnosis not present

## 2016-03-21 NOTE — Progress Notes (Signed)
Zachary Hess  MRN: [id number] DOB: 1960-10-07  Subjective:  HPI  Patient is here for follow up. Last office visit was on 12/13/15. At that time Lipitor was increased to 40 mg, he is tolerating this well. Labs were needed to be done today. He also added Fish oil daily. He is also had a cough for a week. Nasal and chest congestion. No fevers or significant myalgias. He also has had a recent low back strain on the right. Radicular symptoms.  Lab Results  Component Value Date   CHOL 174 06/02/2015   HDL 56 06/02/2015   LDLCALC 94 06/02/2015   TRIG 122 06/02/2015   Patient Active Problem List   Diagnosis Date Noted  . Adaptation reaction 11/29/2014  . Body mass index (BMI) of 26.0-26.9 in adult 11/29/2014  . Essential (primary) hypertension 11/29/2014  . HLD (hyperlipidemia) 11/29/2014  . Mild depression ([place]) 11/29/2014  . Clinical depression 11/29/2014  . Allergic rhinitis, seasonal 11/29/2014  . Avitaminosis D 11/29/2014  . HYPERLIPIDEMIA-MIXED 05/10/2009  . DEPRESSION, MILD 05/10/2009  . HYPERTENSION, BENIGN 05/10/2009    Past Medical History:  Diagnosis Date  . Anxiety   . HBP (high blood pressure)     Social History   Social History  . Marital status: Married    Spouse name: N/A  . Number of children: N/A  . Years of education: N/A   Occupational History  . Not on file.   Social History Main Topics  . Smoking status: Never Smoker  . Smokeless tobacco: Never Used  . Alcohol use Yes     Comment: SOCIALLY  . Drug use: No  . Sexual activity: Not on file   Other Topics Concern  . Not on file   Social History Narrative  . No narrative on file    Outpatient Encounter Prescriptions as of 03/21/2016  Medication Sig Note  . aspirin 81 MG tablet Take by mouth. 11/29/2014: Received from: [facility]  . atorvastatin (LIPITOR) 40 MG tablet Take 1 tablet (40 mg total) by mouth at bedtime.   . Cholecalciferol 1000 UNITS tablet Take by mouth.  11/29/2014: Received from: [facility]  . clobetasol cream (TEMOVATE) 0.05 % Apply twice daily for 1 week out of the month   . Coenzyme Q10 (COQ10) 100 MG CAPS Take by mouth. 11/29/2014: Received from: [facility]  . fluticasone (FLONASE) 50 MCG/ACT nasal spray Place into the nose. 11/29/2014: Medication taken as needed.  Received from: [facility]  . losartan (COZAAR) 50 MG tablet TAKE 1 TABLET DAILY   . Lysine HCl 1000 MG TABS Take by mouth. 11/29/2014: Received from: [facility]  . venlafaxine XR (EFFEXOR-XR) 37.5 MG 24 hr capsule Take 1 capsule (37.5 mg total) by mouth daily.   . Loratadine (CLARITIN PO) Take by mouth daily.    No facility-administered encounter medications on file as of 03/21/2016.     No Known Allergies  Review of Systems  Constitutional: Positive for malaise/fatigue (sometimes).  Respiratory: Negative.   Cardiovascular: Negative.   Gastrointestinal: Negative.   Musculoskeletal: Positive for back pain and myalgias.  Skin: Negative.   Endo/Heme/Allergies: Negative.   Psychiatric/Behavioral: Positive for depression (sometimes).    Objective:  BP 134/82   Pulse 66   Temp 98.2 F (36.8 C)   Resp 14   Wt 220 lb (99.8 kg)   BMI 27.87 kg/m   Physical Exam  Constitutional: He is oriented to person, place, and time and well-developed, well-nourished,  and in no distress.  HENT:  Head: Normocephalic and atraumatic.  Right Ear: External ear normal.  Left Ear: External ear normal.  Nose: Nose normal.  Eyes: Conjunctivae are normal. Pupils are equal, round, and reactive to light.  Neck: Normal range of motion. Neck supple.  Cardiovascular: Normal rate, regular rhythm, normal heart sounds and intact distal pulses.   No murmur heard. Pulmonary/Chest: Effort normal and breath sounds normal. No respiratory distress. He has no wheezes.  Abdominal: Soft.  Neurological: He is alert and oriented to  person, place, and time.  Skin: Skin is warm and dry.  Psychiatric: Mood, memory, affect and judgment normal.    Assessment and Plan :  1. Other hyperlipidemia Check labs today on the higher dose of Lipitor - Comprehensive metabolic panel - Lipid Panel With LDL/HDL Ratio  2. Essential (primary) hypertension - CBC w/Diff/Platelet  3. Mild depression (HCC) - TSH Doing well. 4. Viral upper respiratory tract infection Viral. Patient will continue home remedies, no antibiotic needed at this time. Follow as needed. - CBC w/Diff/Platelet  5. Acute bilateral low back pain without sciatica Apply heat. No prescription medications needed at this time or imaging. Follow as needed.  HPI, Exam and A&P transcribed under direction and in the presence of Miguel Aschoff, MD. I have done the exam and reviewed the chart and it is accurate to the best of my knowledge. Development worker, community has been used and  any errors in dictation or transcription are unintentional. Miguel Aschoff M.D. [facility]

## 2016-03-22 ENCOUNTER — Ambulatory Visit: Payer: BLUE CROSS/BLUE SHIELD | Admitting: Family Medicine

## 2016-03-23 DIAGNOSIS — I10 Essential (primary) hypertension: Secondary | ICD-10-CM | POA: Diagnosis not present

## 2016-03-23 DIAGNOSIS — E784 Other hyperlipidemia: Secondary | ICD-10-CM | POA: Diagnosis not present

## 2016-03-23 DIAGNOSIS — J069 Acute upper respiratory infection, unspecified: Secondary | ICD-10-CM | POA: Diagnosis not present

## 2016-03-23 DIAGNOSIS — B9789 Other viral agents as the cause of diseases classified elsewhere: Secondary | ICD-10-CM | POA: Diagnosis not present

## 2016-03-24 LAB — COMPREHENSIVE METABOLIC PANEL
ALT: 26 IU/L (ref 0–44)
AST: 29 IU/L (ref 0–40)
Albumin/Globulin Ratio: 1.8 (ref 1.2–2.2)
Albumin: 4.6 g/dL (ref 3.5–5.5)
Alkaline Phosphatase: 72 IU/L (ref 39–117)
BUN/Creatinine Ratio: 15 (ref 9–20)
BUN: 14 mg/dL (ref 6–24)
Bilirubin Total: 0.6 mg/dL (ref 0.0–1.2)
CHLORIDE: 101 mmol/L (ref 96–106)
CO2: 26 mmol/L (ref 18–29)
Calcium: 9.5 mg/dL (ref 8.7–10.2)
Creatinine, Ser: 0.93 mg/dL (ref 0.76–1.27)
GFR calc non Af Amer: 91 mL/min/{1.73_m2} (ref >59)
GFR, EST AFRICAN AMERICAN: 105 mL/min/{1.73_m2} (ref >59)
GLUCOSE: 101 mg/dL — AB (ref 65–99)
Globulin, Total: 2.5 g/dL (ref 1.5–4.5)
Potassium: 4.8 mmol/L (ref 3.5–5.2)
Sodium: 142 mmol/L (ref 134–144)
TOTAL PROTEIN: 7.1 g/dL (ref 6.0–8.5)

## 2016-03-24 LAB — CBC WITH DIFFERENTIAL/PLATELET
BASOS ABS: 0 10*3/uL (ref 0.0–0.2)
Basos: 0 %
EOS (ABSOLUTE): 0.3 10*3/uL (ref 0.0–0.4)
Eos: 5 %
Hematocrit: 38.5 % (ref 37.5–51.0)
Hemoglobin: 13.2 g/dL (ref 13.0–17.7)
IMMATURE GRANS (ABS): 0 10*3/uL (ref 0.0–0.1)
Immature Granulocytes: 0 %
LYMPHS: 23 %
Lymphocytes Absolute: 1.5 10*3/uL (ref 0.7–3.1)
MCH: 30.3 pg (ref 26.6–33.0)
MCHC: 34.3 g/dL (ref 31.5–35.7)
MCV: 89 fL (ref 79–97)
MONOS ABS: 0.7 10*3/uL (ref 0.1–0.9)
Monocytes: 11 %
NEUTROS ABS: 4.1 10*3/uL (ref 1.4–7.0)
NEUTROS PCT: 61 %
PLATELETS: 229 10*3/uL (ref 150–379)
RBC: 4.35 x10E6/uL (ref 4.14–5.80)
RDW: 12.6 % (ref 12.3–15.4)
WBC: 6.7 10*3/uL (ref 3.4–10.8)

## 2016-03-24 LAB — LIPID PANEL WITH LDL/HDL RATIO
CHOLESTEROL TOTAL: 138 mg/dL (ref 100–199)
HDL: 51 mg/dL (ref >39)
LDL Calculated: 67 mg/dL (ref 0–99)
LDl/HDL Ratio: 1.3 ratio units (ref 0.0–3.6)
TRIGLYCERIDES: 100 mg/dL (ref 0–149)
VLDL CHOLESTEROL CAL: 20 mg/dL (ref 5–40)

## 2016-03-24 LAB — TSH: TSH: 3.08 u[IU]/mL (ref 0.450–4.500)

## 2016-04-13 ENCOUNTER — Other Ambulatory Visit: Payer: Self-pay

## 2016-04-13 DIAGNOSIS — E7849 Other hyperlipidemia: Secondary | ICD-10-CM

## 2016-04-13 MED ORDER — ATORVASTATIN CALCIUM 40 MG PO TABS: 40.0000 mg | ORAL_TABLET | Freq: Every day | ORAL | 3 refills | Status: DC

## 2016-04-16 ENCOUNTER — Other Ambulatory Visit: Payer: Self-pay

## 2016-04-16 DIAGNOSIS — E7849 Other hyperlipidemia: Secondary | ICD-10-CM

## 2016-04-16 MED ORDER — ATORVASTATIN CALCIUM 40 MG PO TABS: 40.0000 mg | ORAL_TABLET | Freq: Every day | ORAL | 3 refills | Status: DC

## 2016-07-02 ENCOUNTER — Ambulatory Visit (INDEPENDENT_AMBULATORY_CARE_PROVIDER_SITE_OTHER): Payer: BLUE CROSS/BLUE SHIELD | Admitting: Family Medicine

## 2016-07-02 ENCOUNTER — Encounter: Payer: Self-pay | Admitting: Family Medicine

## 2016-07-02 VITALS — BP 116/70 | HR 68 | Temp 97.9°F | Resp 16 | Ht 75.0 in | Wt 215.0 lb

## 2016-07-02 DIAGNOSIS — Z1211 Encounter for screening for malignant neoplasm of colon: Secondary | ICD-10-CM

## 2016-07-02 DIAGNOSIS — Z0000 Encounter for general adult medical examination without abnormal findings: Secondary | ICD-10-CM

## 2016-07-02 DIAGNOSIS — R0683 Snoring: Secondary | ICD-10-CM | POA: Diagnosis not present

## 2016-07-02 DIAGNOSIS — Z125 Encounter for screening for malignant neoplasm of prostate: Secondary | ICD-10-CM

## 2016-07-02 DIAGNOSIS — J301 Allergic rhinitis due to pollen: Secondary | ICD-10-CM | POA: Diagnosis not present

## 2016-07-02 LAB — POCT URINALYSIS DIPSTICK
Bilirubin, UA: NEGATIVE
Glucose, UA: NEGATIVE
Ketones, UA: NEGATIVE
LEUKOCYTES UA: NEGATIVE
NITRITE UA: NEGATIVE
PH UA: 6 (ref 5.0–8.0)
Protein, UA: NEGATIVE
RBC UA: NEGATIVE
Spec Grav, UA: 1.015 (ref 1.010–1.025)
UROBILINOGEN UA: 0.2 U/dL

## 2016-07-02 LAB — IFOBT (OCCULT BLOOD): IFOBT: NEGATIVE

## 2016-07-02 MED ORDER — MONTELUKAST SODIUM 10 MG PO TABS: 10.0000 mg | ORAL_TABLET | Freq: Every day | ORAL | 11 refills | Status: DC

## 2016-07-02 NOTE — Progress Notes (Signed)
Patient: Zachary Hess, Male    DOB: Mar 23, 1960, 56 y.o.   MRN: [id number] Visit Date: 07/02/2016  Today's Provider: [name], MD   Chief Complaint  Patient presents with  . Annual Exam   Subjective:    Annual physical exam Zachary Hess is a 56 y.o. male who presents today for health maintenance and complete physical. He feels well. He reports exercising 3-4 times a week. He reports he is sleeping well.  ----------------------------------------------------------------- Colonoscopy- 06/07/08 normal repeat 10 years  Immunization History  Administered Date(s) Administered  . Hepatitis A 01/24/2011, 03/13/2011, 07/24/2011  . Hepatitis B 01/24/2011, 03/13/2011, 07/24/2011  . Influenza,inj,Quad PF,36+ Mos 11/29/2014, 12/13/2015  . Td 10/04/2003  . Tdap 11/25/2013  . Typhoid Inactivated 03/11/2011     Review of Systems  Constitutional: Negative.   HENT: Positive for rhinorrhea.   Eyes: Negative.   Respiratory: Negative.   Cardiovascular: Negative.   Gastrointestinal: Negative.   Endocrine: Negative.   Genitourinary: Negative.   Musculoskeletal: Positive for back pain and neck stiffness.  Skin: Negative.   Allergic/Immunologic: Positive for environmental allergies.  Neurological: Negative.   Hematological: Negative.   Psychiatric/Behavioral: Negative.     Social History      He  reports that he has never smoked. He has never used smokeless tobacco. He reports that he drinks alcohol. He reports that he does not use drugs.       Social History   Social History  . Marital status: Married    Spouse name: N/A  . Number of children: N/A  . Years of education: N/A   Social History Main Topics  . Smoking status: Never Smoker  . Smokeless tobacco: Never Used  . Alcohol use Yes     Comment: SOCIALLY  . Drug use: No  . Sexual activity: Not Asked   Other Topics Concern  . None   Social History Narrative  . None    Past Medical History:  Diagnosis  Date  . Anxiety   . HBP (high blood pressure)      Patient Active Problem List   Diagnosis Date Noted  . Adaptation reaction 11/29/2014  . Body mass index (BMI) of 26.0-26.9 in adult 11/29/2014  . Essential (primary) hypertension 11/29/2014  . HLD (hyperlipidemia) 11/29/2014  . Mild depression ([place]) 11/29/2014  . Clinical depression 11/29/2014  . Allergic rhinitis, seasonal 11/29/2014  . Avitaminosis D 11/29/2014  . HYPERLIPIDEMIA-MIXED 05/10/2009  . DEPRESSION, MILD 05/10/2009  . HYPERTENSION, BENIGN 05/10/2009    Past Surgical History:  Procedure Laterality Date  . ANAL FISSURE REPAIR    . FOOT SURGERY Left   . MANDIBLE FRACTURE SURGERY    . MOUTH SURGERY     WISDOM TEETH    Family History        Family Status  Relation Status  . Father Deceased at age 21  . Mother Alive  . Sister Alive  . Brother Alive  . Paternal Uncle Deceased  . Maternal Grandmother Deceased at age 48  . Brother Alive  . Paternal Grandfather         His family history includes Atrial fibrillation in his mother; CAD in his father; Congestive Heart Failure in his father; Diabetes in his mother; Heart attack in his father and paternal uncle; Heart disease in his maternal grandmother; Stroke in his paternal grandfather.     No Known Allergies   Current Outpatient Prescriptions:  .  aspirin 81 MG tablet, Take by  mouth., Disp: , Rfl:  .  atorvastatin (LIPITOR) 40 MG tablet, Take 1 tablet (40 mg total) by mouth at bedtime., Disp: 90 tablet, Rfl: 3 .  Cholecalciferol 1000 UNITS tablet, Take by mouth., Disp: , Rfl:  .  Coenzyme Q10 (COQ10) 100 MG CAPS, Take by mouth., Disp: , Rfl:  .  fluticasone (FLONASE) 50 MCG/ACT nasal spray, Place into the nose., Disp: , Rfl:  .  Loratadine (CLARITIN PO), Take by mouth daily., Disp: , Rfl:  .  losartan (COZAAR) 50 MG tablet, TAKE 1 TABLET DAILY, Disp: 90 tablet, Rfl: 3 .  Lysine HCl 1000 MG TABS, Take by mouth., Disp: , Rfl:  .  triamcinolone cream (KENALOG)  0.1 %, , Disp: , Rfl:  .  venlafaxine XR (EFFEXOR-XR) 37.5 MG 24 hr capsule, Take 1 capsule (37.5 mg total) by mouth daily., Disp: 90 capsule, Rfl: 3   Patient Care Team: Jerrol Banana., MD as PCP - General (Family Medicine)      Objective:   Vitals: BP 116/70 (BP Location: Left Arm, Patient Position: Sitting, Cuff Size: Large)   Pulse 68   Temp 97.9 F (36.6 C) (Oral)   Resp 16   Ht 6\' 3"  (1.905 m)   Wt 215 lb (97.5 kg)   BMI 26.87 kg/m    Vitals:   07/02/16 0908  BP: 116/70  Pulse: 68  Resp: 16  Temp: 97.9 F (36.6 C)  TempSrc: Oral  Weight: 215 lb (97.5 kg)  Height: 6\' 3"  (1.905 m)     Physical Exam  Constitutional: He is oriented to person, place, and time. He appears well-developed and well-nourished.  HENT:  Head: Normocephalic and atraumatic.  Right Ear: External ear normal.  Left Ear: External ear normal.  Nose: Nose normal.  Mouth/Throat: Oropharynx is clear and moist.  Eyes: Conjunctivae and EOM are normal. Pupils are equal, round, and reactive to light.  Neck: Normal range of motion. Neck supple.  Cardiovascular: Normal rate, regular rhythm, normal heart sounds and intact distal pulses.   Pulmonary/Chest: Effort normal and breath sounds normal.  Abdominal: Soft. Bowel sounds are normal.  Genitourinary: Rectum normal, prostate normal and penis normal.  Musculoskeletal: Normal range of motion.  Neurological: He is alert and oriented to person, place, and time. He has normal reflexes.  Skin: Skin is warm and dry.  Psychiatric: He has a normal mood and affect. His behavior is normal. Judgment and thought content normal.     Depression Screen PHQ 2/9 Scores 03/21/2016 11/29/2014  PHQ - 2 Score 1 0  PHQ- 9 Score 3 2   .   Assessment & Plan:     Routine Health Maintenance and Physical Exam  Exercise Activities and Dietary recommendations Goals    None      Immunization History  Administered Date(s) Administered  . Hepatitis A  01/24/2011, 03/13/2011, 07/24/2011  . Hepatitis B 01/24/2011, 03/13/2011, 07/24/2011  . Influenza,inj,Quad PF,36+ Mos 11/29/2014, 12/13/2015  . Td 10/04/2003  . Tdap 11/25/2013  . Typhoid Inactivated 03/11/2011    Health Maintenance  Topic Date Due  . HIV Screening  05/27/1973  . INFLUENZA VACCINE  09/26/2016  . COLONOSCOPY  06/08/2018  . TETANUS/TDAP  11/26/2023  . Hepatitis C Screening  Completed   1. Annual physical exam  - CBC with Differential/Platelet - Lipid Panel With LDL/HDL Ratio - TSH - POCT urinalysis dipstick - Comprehensive metabolic panel  2. Prostate cancer screening  - PSA  3. Colon cancer screening  - IFOBT  POC (occult bld, rslt in office); Future  4. Seasonal allergic rhinitis due to pollen  - montelukast (SINGULAIR) 10 MG tablet; Take 1 tablet (10 mg total) by mouth at bedtime.  Dispense: 30 tablet; Refill: 11  5. Snoring Epworth 3 today. Will send for at home sleep study due to apnea and snoring.  - Ambulatory referral to Sleep Studies   Discussed health benefits of physical activity, and encouraged him to engage in regular exercise appropriate for his age and condition.    -------------------------------------------------------------------- I have done the exam and reviewed the chart and it is accurate to the best of my knowledge. Development worker, community has been used and  any errors in dictation or transcription are unintentional. Miguel Aschoff M.D. [place], MD  [facility]

## 2016-07-03 LAB — COMPREHENSIVE METABOLIC PANEL
ALK PHOS: 71 IU/L (ref 39–117)
ALT: 37 IU/L (ref 0–44)
AST: 35 IU/L (ref 0–40)
Albumin/Globulin Ratio: 1.8 (ref 1.2–2.2)
Albumin: 4.7 g/dL (ref 3.5–5.5)
BILIRUBIN TOTAL: 0.8 mg/dL (ref 0.0–1.2)
BUN/Creatinine Ratio: 16 (ref 9–20)
BUN: 16 mg/dL (ref 6–24)
CHLORIDE: 97 mmol/L (ref 96–106)
CO2: 27 mmol/L (ref 18–29)
CREATININE: 0.99 mg/dL (ref 0.76–1.27)
Calcium: 9.9 mg/dL (ref 8.7–10.2)
GFR calc Af Amer: 97 mL/min/{1.73_m2} (ref >59)
GFR calc non Af Amer: 84 mL/min/{1.73_m2} (ref >59)
GLOBULIN, TOTAL: 2.6 g/dL (ref 1.5–4.5)
GLUCOSE: 101 mg/dL — AB (ref 65–99)
Potassium: 5 mmol/L (ref 3.5–5.2)
SODIUM: 137 mmol/L (ref 134–144)
Total Protein: 7.3 g/dL (ref 6.0–8.5)

## 2016-07-03 LAB — LIPID PANEL WITH LDL/HDL RATIO
Cholesterol, Total: 164 mg/dL (ref 100–199)
HDL: 60 mg/dL (ref >39)
LDL CALC: 85 mg/dL (ref 0–99)
LDL/HDL RATIO: 1.4 ratio (ref 0.0–3.6)
Triglycerides: 96 mg/dL (ref 0–149)
VLDL Cholesterol Cal: 19 mg/dL (ref 5–40)

## 2016-07-03 LAB — CBC WITH DIFFERENTIAL/PLATELET
BASOS: 1 %
Basophils Absolute: 0 10*3/uL (ref 0.0–0.2)
EOS (ABSOLUTE): 0.2 10*3/uL (ref 0.0–0.4)
EOS: 2 %
HEMATOCRIT: 39.8 % (ref 37.5–51.0)
Hemoglobin: 13.5 g/dL (ref 13.0–17.7)
Immature Grans (Abs): 0 10*3/uL (ref 0.0–0.1)
Immature Granulocytes: 0 %
LYMPHS ABS: 1.7 10*3/uL (ref 0.7–3.1)
Lymphs: 27 %
MCH: 30.3 pg (ref 26.6–33.0)
MCHC: 33.9 g/dL (ref 31.5–35.7)
MCV: 89 fL (ref 79–97)
MONOS ABS: 0.5 10*3/uL (ref 0.1–0.9)
Monocytes: 8 %
Neutrophils Absolute: 4 10*3/uL (ref 1.4–7.0)
Neutrophils: 62 %
Platelets: 228 10*3/uL (ref 150–379)
RBC: 4.45 x10E6/uL (ref 4.14–5.80)
RDW: 13.6 % (ref 12.3–15.4)
WBC: 6.4 10*3/uL (ref 3.4–10.8)

## 2016-07-03 LAB — TSH: TSH: 2.76 u[IU]/mL (ref 0.450–4.500)

## 2016-07-03 LAB — PSA: Prostate Specific Ag, Serum: 1.4 ng/mL (ref 0.0–4.0)

## 2016-07-18 DIAGNOSIS — G4733 Obstructive sleep apnea (adult) (pediatric): Secondary | ICD-10-CM | POA: Diagnosis not present

## 2016-07-18 DIAGNOSIS — R0602 Shortness of breath: Secondary | ICD-10-CM | POA: Diagnosis not present

## 2016-07-19 DIAGNOSIS — G4733 Obstructive sleep apnea (adult) (pediatric): Secondary | ICD-10-CM | POA: Diagnosis not present

## 2016-07-19 DIAGNOSIS — R0602 Shortness of breath: Secondary | ICD-10-CM | POA: Diagnosis not present

## 2016-08-14 DIAGNOSIS — G4733 Obstructive sleep apnea (adult) (pediatric): Secondary | ICD-10-CM | POA: Diagnosis not present

## 2016-08-14 DIAGNOSIS — R0602 Shortness of breath: Secondary | ICD-10-CM | POA: Diagnosis not present

## 2016-09-13 DIAGNOSIS — R0602 Shortness of breath: Secondary | ICD-10-CM | POA: Diagnosis not present

## 2016-09-13 DIAGNOSIS — G4733 Obstructive sleep apnea (adult) (pediatric): Secondary | ICD-10-CM | POA: Diagnosis not present

## 2016-10-14 DIAGNOSIS — G4733 Obstructive sleep apnea (adult) (pediatric): Secondary | ICD-10-CM | POA: Diagnosis not present

## 2016-10-14 DIAGNOSIS — R0602 Shortness of breath: Secondary | ICD-10-CM | POA: Diagnosis not present

## 2016-12-11 DIAGNOSIS — G4733 Obstructive sleep apnea (adult) (pediatric): Secondary | ICD-10-CM | POA: Diagnosis not present

## 2017-01-03 ENCOUNTER — Encounter: Payer: Self-pay | Admitting: Family Medicine

## 2017-01-03 ENCOUNTER — Ambulatory Visit (INDEPENDENT_AMBULATORY_CARE_PROVIDER_SITE_OTHER): Payer: BLUE CROSS/BLUE SHIELD | Admitting: Family Medicine

## 2017-01-03 VITALS — BP 110/64 | HR 64 | Temp 97.6°F | Resp 16 | Wt 214.0 lb

## 2017-01-03 DIAGNOSIS — Z23 Encounter for immunization: Secondary | ICD-10-CM

## 2017-01-03 DIAGNOSIS — G4730 Sleep apnea, unspecified: Secondary | ICD-10-CM

## 2017-01-03 DIAGNOSIS — I10 Essential (primary) hypertension: Secondary | ICD-10-CM

## 2017-01-03 HISTORY — DX: Sleep apnea, unspecified: G47.30

## 2017-01-03 NOTE — Progress Notes (Signed)
Zachary Hess  MRN: [id number] DOB: 09-09-1960  Subjective:  HPI   The patient is a 56 year old male who presents for follow up of chronic issues.  He was last seen on 07/02/16 for his annual exam.  He was started on Singulair for his allergic rhinitis and a sleep study was performed on 07/18/16 due to his snoring.  The patient states he took the whole prescription and did not feel he noticed any improvment and did not get it refilled.   Hypertension- The patient currently takes Losartan for his blood pressure.  He reports no complaints and states when he checks his blood pressure outside of the office he gets readings of 120'x over 60's.  BP Readings from Last 3 Encounters:  01/03/17 110/64  07/02/16 116/70  03/21/16 134/82   Snoring- The sleep study reports stated that based on the study the patient had mild decrease in maximum voluntary ventilation with normal spirometry. The patient is using the CPAP and states he feels much better when using it.  He wakes in the morning without feeling sleepy during the day.  The patient would like top get his flu vaccine today.  Patient Active Problem List   Diagnosis Date Noted  . Adaptation reaction 11/29/2014  . Body mass index (BMI) of 26.0-26.9 in adult 11/29/2014  . Essential (primary) hypertension 11/29/2014  . HLD (hyperlipidemia) 11/29/2014  . Mild depression ([place]) 11/29/2014  . Clinical depression 11/29/2014  . Allergic rhinitis, seasonal 11/29/2014  . Avitaminosis D 11/29/2014  . HYPERLIPIDEMIA-MIXED 05/10/2009  . DEPRESSION, MILD 05/10/2009  . HYPERTENSION, BENIGN 05/10/2009    Past Medical History:  Diagnosis Date  . Anxiety   . HBP (high blood pressure)     Social History   Socioeconomic History  . Marital status: Married    Spouse name: Not on file  . Number of children: Not on file  . Years of education: Not on file  . Highest education level: Not on file  Social Needs  . Financial resource strain: Not on file  .  Food insecurity - worry: Not on file  . Food insecurity - inability: Not on file  . Transportation needs - medical: Not on file  . Transportation needs - non-medical: Not on file  Occupational History  . Not on file  Tobacco Use  . Smoking status: Never Smoker  . Smokeless tobacco: Never Used  Substance and Sexual Activity  . Alcohol use: Yes    Comment: SOCIALLY  . Drug use: No  . Sexual activity: Not on file  Other Topics Concern  . Not on file  Social History Narrative  . Not on file    Outpatient Encounter Medications as of 01/03/2017  Medication Sig Note  . aspirin 81 MG tablet Take by mouth. 11/29/2014: Received from: [facility]  . atorvastatin (LIPITOR) 40 MG tablet Take 1 tablet (40 mg total) by mouth at bedtime.   . Cholecalciferol 1000 UNITS tablet Take by mouth. 11/29/2014: Received from: [facility]  . Coenzyme Q10 (COQ10) 100 MG CAPS Take by mouth. 11/29/2014: Received from: [facility]  . fluticasone (FLONASE) 50 MCG/ACT nasal spray Place into the nose. 11/29/2014: Medication taken as needed.  Received from: [facility]  . Loratadine (CLARITIN PO) Take by mouth daily.   Marland Kitchen losartan (COZAAR) 50 MG tablet TAKE 1 TABLET DAILY   . Lysine HCl 1000 MG TABS Take by mouth. 11/29/2014: Received from: [facility]  . triamcinolone cream (  KENALOG) 0.1 %    . venlafaxine XR (EFFEXOR-XR) 37.5 MG 24 hr capsule Take 1 capsule (37.5 mg total) by mouth daily.   . [DISCONTINUED] montelukast (SINGULAIR) 10 MG tablet Take 1 tablet (10 mg total) by mouth at bedtime.    No facility-administered encounter medications on file as of 01/03/2017.     No Known Allergies  Review of Systems  Constitutional: Negative for fever and malaise/fatigue.  Eyes: Negative.   Respiratory: Negative for cough, shortness of breath and wheezing.   Cardiovascular: Negative for chest pain, palpitations, orthopnea,  claudication and leg swelling.  Skin: Negative.   Neurological: Negative for dizziness, weakness and headaches.  Endo/Heme/Allergies: Negative.   Psychiatric/Behavioral: Negative.     Objective:  BP 110/64 (BP Location: Right Arm, Patient Position: Sitting, Cuff Size: Normal)   Pulse 64   Temp 97.6 F (36.4 C) (Oral)   Resp 16   Wt 214 lb (97.1 kg)   BMI 26.75 kg/m   Physical Exam  Constitutional: He is oriented to person, place, and time and well-developed, well-nourished, and in no distress.  HENT:  Head: Normocephalic and atraumatic.  Eyes: Pupils are equal, round, and reactive to light.  Neck: Normal range of motion.  Cardiovascular: Normal rate, regular rhythm and normal heart sounds.  Pulmonary/Chest: Effort normal and breath sounds normal.  Neurological: He is alert and oriented to person, place, and time. Gait normal. GCS score is 15.  Skin: Skin is warm and dry.  Psychiatric: Mood, memory, affect and judgment normal.    Assessment and Plan :  OSA CPAP helps. HLD AR  I have done the exam and reviewed the chart and it is accurate to the best of my knowledge. Development worker, community has been used and  any errors in dictation or transcription are unintentional. Miguel Aschoff M.D. [facility]

## 2017-01-10 ENCOUNTER — Other Ambulatory Visit: Payer: Self-pay | Admitting: Family Medicine

## 2017-02-09 ENCOUNTER — Other Ambulatory Visit: Payer: Self-pay | Admitting: Physician Assistant

## 2017-02-11 NOTE — Telephone Encounter (Signed)
Pharmacy requesting refills. Please review. Thanks!

## 2017-02-23 IMAGING — CT CT HEART MORPH/PULM VEIN W/ CM & W/O CA SCORE
1 of 5 series · 1 of 20 positions shown, 2 images · non-contrast
Comparison: No priors.

MEDICATIONS:
Lopressor:  50  mg, P.O.

Nitroglycerin 400 mcg, sublingual

CLINICAL DATA: 55-year-old male with history of hypertension and
hyperlipidemia.

[Series 300: locator · axial · 0.35mm/px · z∈[-173,-173]mm · 1 of 1 slices shown, 2 images]
[im 1/1  vessel]
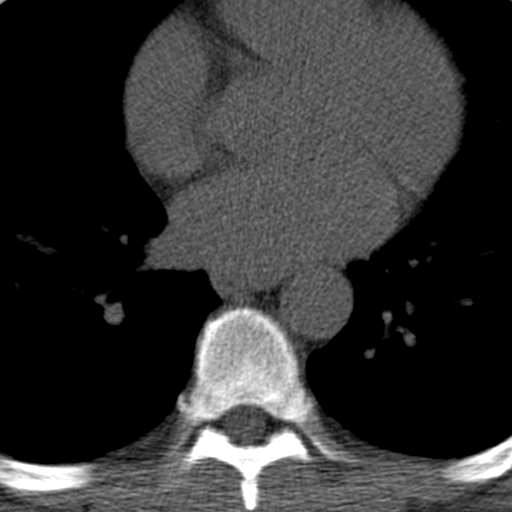
[im 1/1  lung]
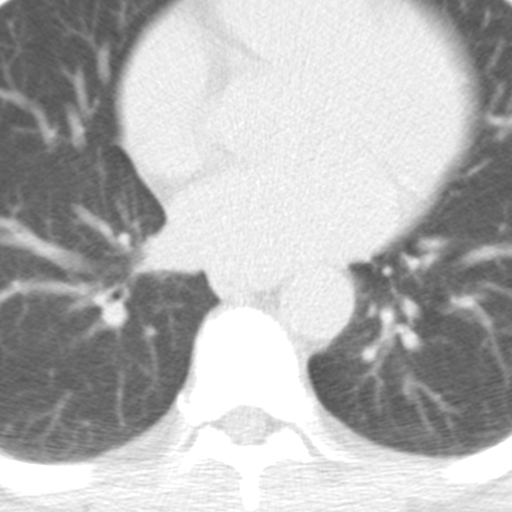

[1 of 20 positions shown; findings below may reference images not displayed]

EXAM:
CT ANGIOGRAPHY OF THE HEART, CORONARY ARTERY, STRUCTURE, AND
MORPHOLOGY

PROCEDURE:
CT angiography of the coronary vessels was performed on a 256
channel system using prospective ECG gating. A scout and ECG-gated
noncontrast exam (for calcium scoring) were performed. Appropriate
delay was determined by bolus tracking after injection of iodinated
contrast, and an ECG-gated coronary CTA was performed with sub-mm
slice collimation during late diastole. Imaging post processing was
performed on an independent workstation creating multiplanar and 3-D
images, allowing for quantitative analysis of the heart and coronary
arteries. Note that this exam targets the heart and the chest was
not imaged in its entirety.
FINDINGS: Technical quality: Excellent. Minimal misregistration artifact
affecting the LAD and RCA.

Heart rate:  45 BPM

CORONARY ARTERIES:

Left Main:   Negative.

LAD: Mildly disease with 0-25% stenosis in the proximal, mid and
distal left anterior descending coronary artery.

Diagonals:  Negative.

LCx:  Negative.

OMs:  Negative.

RCA:  Mildly disease with 0-25% stenosis in the mid and distal RCA.

PDA:  Negative.

Dominance:  Right.

CORONARY CALCIUM: Total Agatston Score:  115

[HOSPITAL] percentile:  77th

AORTA AND PULMONARY MEASUREMENTS:

Aortic root (21 - 40 mm):

28  mm at the annulus

35  mm at the sinuses of Valsalva

24  mm at the sinotubular junction

Ascending aorta: (< 40 mm):  29 mm

Descending aorta:  (< 40 mm):  25 mm

Main pulmonary artery: (< 30 mm):  25 mm

OTHER FINDINGS: Within the visualized portions of the thorax there
are no suspicious appearing pulmonary nodules or masses, there is no
acute consolidative airspace disease, no pleural effusions, no
pneumothorax and no lymphadenopathy. Visualized portions of the
upper abdomen are unremarkable. No aggressive appearing lytic or
blastic lesions are noted in the visualized portions of the
skeleton.
IMPRESSION: 1. Mild nonobstructive coronary artery atherosclerosis involving
predominantly the LAD and RCA, as discussed above. Patient's total
coronary artery calcium score is 115 which is seventy-seventh
percentile for patient's of matched age, gender and race/ethnicity.
2. No significant incidental noncardiac findings noted.

## 2017-03-29 ENCOUNTER — Other Ambulatory Visit: Payer: Self-pay | Admitting: Family Medicine

## 2017-03-29 DIAGNOSIS — E7849 Other hyperlipidemia: Secondary | ICD-10-CM

## 2017-05-06 DIAGNOSIS — G4733 Obstructive sleep apnea (adult) (pediatric): Secondary | ICD-10-CM | POA: Diagnosis not present

## 2017-07-03 ENCOUNTER — Encounter: Payer: Self-pay | Admitting: Family Medicine

## 2017-07-10 ENCOUNTER — Encounter: Payer: Self-pay | Admitting: Family Medicine

## 2017-07-10 ENCOUNTER — Other Ambulatory Visit: Payer: Self-pay

## 2017-07-10 ENCOUNTER — Ambulatory Visit (INDEPENDENT_AMBULATORY_CARE_PROVIDER_SITE_OTHER): Payer: BLUE CROSS/BLUE SHIELD | Admitting: Family Medicine

## 2017-07-10 VITALS — BP 110/62 | HR 64 | Temp 98.1°F | Resp 16 | Wt 219.0 lb

## 2017-07-10 DIAGNOSIS — Z1211 Encounter for screening for malignant neoplasm of colon: Secondary | ICD-10-CM

## 2017-07-10 DIAGNOSIS — Z125 Encounter for screening for malignant neoplasm of prostate: Secondary | ICD-10-CM

## 2017-07-10 DIAGNOSIS — Z0000 Encounter for general adult medical examination without abnormal findings: Secondary | ICD-10-CM | POA: Diagnosis not present

## 2017-07-10 DIAGNOSIS — L570 Actinic keratosis: Secondary | ICD-10-CM

## 2017-07-10 LAB — POCT URINALYSIS DIPSTICK
Bilirubin, UA: NEGATIVE
Blood, UA: NEGATIVE
Glucose, UA: NEGATIVE
Ketones, UA: NEGATIVE
Leukocytes, UA: NEGATIVE
Nitrite, UA: NEGATIVE
Protein, UA: NEGATIVE
Spec Grav, UA: 1.01
Urobilinogen, UA: 0.2 U/dL
pH, UA: 7

## 2017-07-10 LAB — IFOBT (OCCULT BLOOD): IFOBT: NEGATIVE

## 2017-07-10 NOTE — Progress Notes (Addendum)
Patient: Zachary Hess, Male    DOB: August 28, 1960, 57 y.o.   MRN: [id number] Visit Date: 07/10/2017  Today's Provider: [name], MD   No chief complaint on file.  Subjective:  Zachary Hess is a 57 y.o. male who presents today for health maintenance and complete physical. He feels well. He reports exercising 3 times weekly. He reports he is sleeping well.  Patient will also need to have form filled out for his CPAP supplies  Immunization History  Administered Date(s) Administered  . Hepatitis A 01/24/2011, 03/13/2011, 07/24/2011  . Hepatitis B 01/24/2011, 03/13/2011, 07/24/2011  . Influenza,inj,Quad PF,6+ Mos 11/29/2014, 12/13/2015, 01/03/2017  . Td 10/04/2003  . Tdap 11/25/2013  . Typhoid Inactivated 03/11/2011   06/07/2008 Colonoscopy-no report in chart , done in [place] per patient; no abnormalities, f/u 10 years.   Review of Systems  Constitutional: Negative.   HENT: Positive for congestion.   Eyes: Negative.   Respiratory: Negative.   Cardiovascular: Negative.   Gastrointestinal: Negative.   Endocrine: Negative.   Genitourinary: Negative.   Musculoskeletal: Positive for neck stiffness.  Skin: Negative.   Allergic/Immunologic: Positive for environmental allergies.  Neurological: Negative.   Hematological: Negative.   Psychiatric/Behavioral: Negative.     Social History   Socioeconomic History  . Marital status: Married    Spouse name: Not on file  . Number of children: Not on file  . Years of education: Not on file  . Highest education level: Not on file  Occupational History  . Not on file  Social Needs  . Financial resource strain: Not on file  . Food insecurity:    Worry: Not on file    Inability: Not on file  . Transportation needs:    Medical: Not on file    Non-medical: Not on file  Tobacco Use  . Smoking status: Never Smoker  . Smokeless tobacco: Never Used  Substance and Sexual Activity  . Alcohol use: Yes    Comment: SOCIALLY  . Drug  use: No  . Sexual activity: Not on file  Lifestyle  . Physical activity:    Days per week: Not on file    Minutes per session: Not on file  . Stress: Not on file  Relationships  . Social connections:    Talks on phone: Not on file    Gets together: Not on file    Attends religious service: Not on file    Active member of club or organization: Not on file    Attends meetings of clubs or organizations: Not on file    Relationship status: Not on file  . Intimate partner violence:    Fear of current or ex partner: Not on file    Emotionally abused: Not on file    Physically abused: Not on file    Forced sexual activity: Not on file  Other Topics Concern  . Not on file  Social History Narrative  . Not on file    Patient Active Problem List   Diagnosis Date Noted  . Sleep apnea 01/03/2017  . Adaptation reaction 11/29/2014  . Body mass index (BMI) of 26.0-26.9 in adult 11/29/2014  . Essential (primary) hypertension 11/29/2014  . HLD (hyperlipidemia) 11/29/2014  . Mild depression ([place]) 11/29/2014  . Clinical depression 11/29/2014  . Allergic rhinitis, seasonal 11/29/2014  . Avitaminosis D 11/29/2014  . HYPERLIPIDEMIA-MIXED 05/10/2009  . DEPRESSION, MILD 05/10/2009  . HYPERTENSION, BENIGN 05/10/2009    Past Surgical History:  Procedure Laterality Date  . ANAL  FISSURE REPAIR    . FOOT SURGERY Left   . MANDIBLE FRACTURE SURGERY    . MOUTH SURGERY     WISDOM TEETH    His family history includes Atrial fibrillation in his mother; CAD in his father; Congestive Heart Failure in his father; Diabetes in his mother; Heart attack in his father and paternal uncle; Heart disease in his maternal grandmother; Stroke in his paternal grandfather.     Outpatient Encounter Medications as of 07/10/2017  Medication Sig Note  . aspirin 81 MG tablet Take by mouth. 11/29/2014: Received from: [facility]  . atorvastatin (LIPITOR) 40 MG tablet Take 1 tablet (40 mg total) by  mouth at bedtime.   Marland Kitchen atorvastatin (LIPITOR) 40 MG tablet TAKE 1 TABLET AT BEDTIME   . Cholecalciferol 1000 UNITS tablet Take by mouth. 11/29/2014: Received from: [facility]  . Coenzyme Q10 (COQ10) 100 MG CAPS Take by mouth. 11/29/2014: Received from: [facility]  . fluticasone (FLONASE) 50 MCG/ACT nasal spray Place into the nose. 11/29/2014: Medication taken as needed.  Received from: [facility]  . Loratadine (CLARITIN PO) Take by mouth daily.   Marland Kitchen losartan (COZAAR) 50 MG tablet TAKE 1 TABLET DAILY   . losartan (COZAAR) 50 MG tablet TAKE 1 TABLET DAILY   . Lysine HCl 1000 MG TABS Take by mouth. 11/29/2014: Received from: [facility]  . triamcinolone cream (KENALOG) 0.1 %    . venlafaxine XR (EFFEXOR-XR) 37.5 MG 24 hr capsule TAKE 1 CAPSULE DAILY    No facility-administered encounter medications on file as of 07/10/2017.     Patient Care Team: Jerrol Banana., MD as PCP - General (Family Medicine)      Objective:   Vitals: There were no vitals filed for this visit.  Physical Exam  Constitutional: He is oriented to person, place, and time. He appears well-developed and well-nourished.  HENT:  Head: Normocephalic and atraumatic.  Right Ear: External ear normal.  Left Ear: External ear normal.  Nose: Nose normal.  Mouth/Throat: Oropharynx is clear and moist.  Right TM erythematous.  Eyes: Pupils are equal, round, and reactive to light. Conjunctivae and EOM are normal.  Neck: Normal range of motion. Neck supple.  Cardiovascular: Normal rate, regular rhythm, normal heart sounds and intact distal pulses.  Pulmonary/Chest: Effort normal and breath sounds normal.  Abdominal: Soft. Bowel sounds are normal.  Genitourinary: Rectum normal, prostate normal and penis normal.  Musculoskeletal: Normal range of motion.  Neurological: He is alert and oriented to person, place, and time.  Skin: Skin is warm and dry.   Apparent AKs of forehead.  Psychiatric: He has a normal mood and affect. His behavior is normal. Judgment and thought content normal.     Depression Screen PHQ 2/9 Scores 03/21/2016 11/29/2014  PHQ - 2 Score 1 0  PHQ- 9 Score 3 2      Assessment & Plan:     Routine Health Maintenance and Physical Exam  Exercise Activities and Dietary recommendations Goals    None      Immunization History  Administered Date(s) Administered  . Hepatitis A 01/24/2011, 03/13/2011, 07/24/2011  . Hepatitis B 01/24/2011, 03/13/2011, 07/24/2011  . Influenza,inj,Quad PF,6+ Mos 11/29/2014, 12/13/2015, 01/03/2017  . Td 10/04/2003  . Tdap 11/25/2013  . Typhoid Inactivated 03/11/2011    Health Maintenance  Topic Date Due  . HIV Screening  05/27/1973  . INFLUENZA VACCINE  09/26/2017  . COLONOSCOPY  06/08/2018  . TETANUS/TDAP  11/26/2023  .  Hepatitis C Screening  Completed     Discussed health benefits of physical activity, and encouraged him to engage in regular exercise appropriate for his age and condition.

## 2017-07-12 DIAGNOSIS — Z0000 Encounter for general adult medical examination without abnormal findings: Secondary | ICD-10-CM | POA: Diagnosis not present

## 2017-07-12 DIAGNOSIS — Z125 Encounter for screening for malignant neoplasm of prostate: Secondary | ICD-10-CM | POA: Diagnosis not present

## 2017-07-13 LAB — CBC WITH DIFFERENTIAL/PLATELET
BASOS: 0 %
Basophils Absolute: 0 10*3/uL (ref 0.0–0.2)
EOS (ABSOLUTE): 0.3 10*3/uL (ref 0.0–0.4)
Eos: 6 %
HEMOGLOBIN: 13.1 g/dL (ref 13.0–17.7)
Hematocrit: 39.1 % (ref 37.5–51.0)
IMMATURE GRANS (ABS): 0 10*3/uL (ref 0.0–0.1)
Immature Granulocytes: 0 %
LYMPHS: 27 %
Lymphocytes Absolute: 1.5 10*3/uL (ref 0.7–3.1)
MCH: 29.9 pg (ref 26.6–33.0)
MCHC: 33.5 g/dL (ref 31.5–35.7)
MCV: 89 fL (ref 79–97)
MONOCYTES: 9 %
Monocytes Absolute: 0.5 10*3/uL (ref 0.1–0.9)
NEUTROS PCT: 58 %
Neutrophils Absolute: 3.2 10*3/uL (ref 1.4–7.0)
PLATELETS: 235 10*3/uL (ref 150–379)
RBC: 4.38 x10E6/uL (ref 4.14–5.80)
RDW: 13.2 % (ref 12.3–15.4)
WBC: 5.5 10*3/uL (ref 3.4–10.8)

## 2017-07-13 LAB — COMPREHENSIVE METABOLIC PANEL
ALBUMIN: 4.6 g/dL (ref 3.5–5.5)
ALK PHOS: 70 IU/L (ref 39–117)
ALT: 37 IU/L (ref 0–44)
AST: 31 IU/L (ref 0–40)
Albumin/Globulin Ratio: 1.9 (ref 1.2–2.2)
BILIRUBIN TOTAL: 0.9 mg/dL (ref 0.0–1.2)
BUN/Creatinine Ratio: 14 (ref 9–20)
BUN: 14 mg/dL (ref 6–24)
CHLORIDE: 100 mmol/L (ref 96–106)
CO2: 25 mmol/L (ref 20–29)
CREATININE: 1 mg/dL (ref 0.76–1.27)
Calcium: 9.8 mg/dL (ref 8.7–10.2)
GFR calc Af Amer: 95 mL/min/{1.73_m2} (ref >59)
GFR calc non Af Amer: 82 mL/min/{1.73_m2} (ref >59)
GLUCOSE: 96 mg/dL (ref 65–99)
Globulin, Total: 2.4 g/dL (ref 1.5–4.5)
Potassium: 4.6 mmol/L (ref 3.5–5.2)
Sodium: 139 mmol/L (ref 134–144)
TOTAL PROTEIN: 7 g/dL (ref 6.0–8.5)

## 2017-07-13 LAB — TSH: TSH: 2.57 u[IU]/mL (ref 0.450–4.500)

## 2017-07-13 LAB — LIPID PANEL WITH LDL/HDL RATIO
CHOLESTEROL TOTAL: 129 mg/dL (ref 100–199)
HDL: 51 mg/dL (ref >39)
LDL CALC: 63 mg/dL (ref 0–99)
LDl/HDL Ratio: 1.2 ratio (ref 0.0–3.6)
Triglycerides: 77 mg/dL (ref 0–149)
VLDL CHOLESTEROL CAL: 15 mg/dL (ref 5–40)

## 2017-07-13 LAB — PSA: Prostate Specific Ag, Serum: 1.3 ng/mL (ref 0.0–4.0)

## 2017-07-15 NOTE — Progress Notes (Signed)
Advised  ED 

## 2017-07-18 ENCOUNTER — Other Ambulatory Visit: Payer: Self-pay | Admitting: Family Medicine

## 2017-07-18 MED ORDER — SCOPOLAMINE 1 MG/3DAYS TD PT72: 1.0000 | MEDICATED_PATCH | TRANSDERMAL | 12 refills | Status: DC

## 2017-07-18 NOTE — Telephone Encounter (Signed)
Pt needs a prescription for ear patches for motion sickness,  He is leaving this afternoon for a trip where he would be on a boat.  [facility]  He would like asap  Call back is [phone] if you need to call him  Thanks teri

## 2017-08-22 ENCOUNTER — Telehealth: Payer: Self-pay | Admitting: Family Medicine

## 2017-08-22 NOTE — Telephone Encounter (Signed)
This prescription was sent into the pharmacy ([facility]) on 07/18/2017 with 12 additional refills. Patient should be able to use the remaining refills. Tried calling patient. Left message for him to call back.

## 2017-08-22 NOTE — Telephone Encounter (Signed)
Please advise 

## 2017-08-22 NOTE — Telephone Encounter (Signed)
Pt is going [place] sea fishing next week and would like a prescription for motion sickness patches sent to [facility]

## 2017-08-22 NOTE — Telephone Encounter (Signed)
Transderm Scop patches q 3 day--box of 4.

## 2017-08-23 NOTE — Telephone Encounter (Signed)
Patient advised.

## 2017-09-04 DIAGNOSIS — X32XXXA Exposure to sunlight, initial encounter: Secondary | ICD-10-CM | POA: Diagnosis not present

## 2017-09-04 DIAGNOSIS — L2089 Other atopic dermatitis: Secondary | ICD-10-CM | POA: Diagnosis not present

## 2017-09-04 DIAGNOSIS — L570 Actinic keratosis: Secondary | ICD-10-CM | POA: Diagnosis not present

## 2017-09-23 DIAGNOSIS — H10022 Other mucopurulent conjunctivitis, left eye: Secondary | ICD-10-CM | POA: Diagnosis not present

## 2017-11-19 DIAGNOSIS — G4733 Obstructive sleep apnea (adult) (pediatric): Secondary | ICD-10-CM | POA: Diagnosis not present

## 2017-12-25 ENCOUNTER — Other Ambulatory Visit: Payer: Self-pay | Admitting: Family Medicine

## 2018-01-09 ENCOUNTER — Ambulatory Visit (INDEPENDENT_AMBULATORY_CARE_PROVIDER_SITE_OTHER): Payer: BLUE CROSS/BLUE SHIELD | Admitting: Family Medicine

## 2018-01-09 VITALS — BP 132/68 | HR 74 | Temp 98.3°F | Resp 16 | Wt 219.0 lb

## 2018-01-09 DIAGNOSIS — F320 Major depressive disorder, single episode, mild: Secondary | ICD-10-CM

## 2018-01-09 DIAGNOSIS — I10 Essential (primary) hypertension: Secondary | ICD-10-CM

## 2018-01-09 DIAGNOSIS — F32A Depression, unspecified: Secondary | ICD-10-CM

## 2018-01-09 DIAGNOSIS — Z23 Encounter for immunization: Secondary | ICD-10-CM

## 2018-01-09 DIAGNOSIS — E7849 Other hyperlipidemia: Secondary | ICD-10-CM | POA: Diagnosis not present

## 2018-01-09 NOTE — Progress Notes (Signed)
Zachary Hess  MRN: [id number] DOB: Jun 24, 1960  Subjective:  HPI   The patient is a 57 year old male who presents for follow up of chronic health.  He was last seen on 07/10/17 for his annual physical.  Routine labs were done at that time and all returned normal.    1. Essential (primary) hypertension BP Readings from Last 3 Encounters:  01/09/18 132/68  07/10/17 110/62  01/03/17 110/64   2. Mild depression ([place]) Depression screen [facility] 2/9 01/09/2018 07/10/2017 07/10/2017 03/21/2016 11/29/2014  Decreased Interest 0 0 0 0 0  Down, Depressed, Hopeless 0 0 0 1 0  PHQ - 2 Score 0 0 0 1 0  Altered sleeping 0 0 - 0 0  Tired, decreased energy 0 0 - 1 1  Change in appetite 0 0 - 0 0  Feeling bad or failure about yourself  0 0 - 0 0  Trouble concentrating 0 0 - 1 1  Moving slowly or fidgety/restless 0 0 - 0 0  Suicidal thoughts 0 0 - 0 0  PHQ-9 Score 0 0 - 3 2  Difficult doing work/chores Not difficult at all - - - Not difficult at all   3. Other hyperlipidemia Lab Results  Component Value Date   CHOL 129 07/12/2017   HDL 51 07/12/2017   LDLCALC 63 07/12/2017   TRIG 77 07/12/2017    Patient Active Problem List   Diagnosis Date Noted  . Sleep apnea 01/03/2017  . Adaptation reaction 11/29/2014  . Body mass index (BMI) of 26.0-26.9 in adult 11/29/2014  . Essential (primary) hypertension 11/29/2014  . HLD (hyperlipidemia) 11/29/2014  . Mild depression ([place]) 11/29/2014  . Clinical depression 11/29/2014  . Allergic rhinitis, seasonal 11/29/2014  . Avitaminosis D 11/29/2014  . HYPERLIPIDEMIA-MIXED 05/10/2009  . DEPRESSION, MILD 05/10/2009  . HYPERTENSION, BENIGN 05/10/2009    Past Medical History:  Diagnosis Date  . Anxiety   . HBP (high blood pressure)   . Sleep apnea 01/03/2017    Social History   Socioeconomic History  . Marital status: Married    Spouse name: Not on file  . Number of children: Not on file  . Years of education: Not on file  . Highest education  level: Not on file  Occupational History  . Not on file  Social Needs  . Financial resource strain: Not on file  . Food insecurity:    Worry: Not on file    Inability: Not on file  . Transportation needs:    Medical: Not on file    Non-medical: Not on file  Tobacco Use  . Smoking status: Never Smoker  . Smokeless tobacco: Never Used  Substance and Sexual Activity  . Alcohol use: Yes    Alcohol/week: 2.0 standard drinks    Types: 2 Standard drinks or equivalent per week    Comment: SOCIALLY  . Drug use: No  . Sexual activity: Not on file  Lifestyle  . Physical activity:    Days per week: Not on file    Minutes per session: Not on file  . Stress: Not on file  Relationships  . Social connections:    Talks on phone: Not on file    Gets together: Not on file    Attends religious service: Not on file    Active member of club or organization: Not on file    Attends meetings of clubs or organizations: Not on file    Relationship status: Not on file  .  Intimate partner violence:    Fear of current or ex partner: Not on file    Emotionally abused: Not on file    Physically abused: Not on file    Forced sexual activity: Not on file  Other Topics Concern  . Not on file  Social History Narrative  . Not on file    Outpatient Encounter Medications as of 01/09/2018  Medication Sig Note  . aspirin 81 MG tablet Take by mouth. 11/29/2014: Received from: [facility]  . atorvastatin (LIPITOR) 40 MG tablet TAKE 1 TABLET AT BEDTIME   . Cholecalciferol 1000 UNITS tablet Take by mouth. 11/29/2014: Received from: [facility]  . Coenzyme Q10 (COQ10) 100 MG CAPS Take by mouth. 11/29/2014: Received from: [facility]  . fluticasone (FLONASE) 50 MCG/ACT nasal spray Place into the nose. 11/29/2014: Medication taken as needed.  Received from: [facility]  . Loratadine (CLARITIN PO) Take by mouth daily.   Marland Kitchen losartan (COZAAR) 50 MG  tablet TAKE 1 TABLET DAILY   . losartan (COZAAR) 50 MG tablet TAKE 1 TABLET DAILY   . triamcinolone cream (KENALOG) 0.1 %    . venlafaxine XR (EFFEXOR-XR) 37.5 MG 24 hr capsule TAKE 1 CAPSULE DAILY   . [DISCONTINUED] scopolamine (TRANSDERM-SCOP) 1 MG/3DAYS Place 1 patch (1.5 mg total) onto the skin every 3 (three) days.    No facility-administered encounter medications on file as of 01/09/2018.     No Known Allergies  Review of Systems  Constitutional: Negative for fever and malaise/fatigue.  HENT: Negative.   Eyes: Negative.   Respiratory: Negative for cough, shortness of breath and wheezing.   Cardiovascular: Negative for chest pain, palpitations, orthopnea, claudication and leg swelling.  Gastrointestinal: Negative.   Skin: Negative.   Endo/Heme/Allergies: Negative.   Psychiatric/Behavioral: Negative.     Objective:  BP 132/68 (BP Location: Right Arm, Patient Position: Sitting, Cuff Size: Normal)   Pulse 74   Temp 98.3 F (36.8 C) (Oral)   Resp 16   Wt 219 lb (99.3 kg)   SpO2 98%   BMI 27.37 kg/m   Physical Exam  Constitutional: He is oriented to person, place, and time and well-developed, well-nourished, and in no distress.  HENT:  Head: Normocephalic and atraumatic.  Right Ear: External ear normal.  Left Ear: External ear normal.  Nose: Nose normal.  Eyes: Conjunctivae are normal. No scleral icterus.  Neck: No thyromegaly present.  Cardiovascular: Normal rate, regular rhythm and normal heart sounds.  Pulmonary/Chest: Effort normal and breath sounds normal.  Abdominal: Soft. Bowel sounds are normal.  Musculoskeletal: He exhibits no edema.  Neurological: He is alert and oriented to person, place, and time. Gait normal. GCS score is 15.  Skin: Skin is warm and dry.  Psychiatric: Mood, memory, affect and judgment normal.    Assessment and Plan :  1. Essential (primary) hypertension Controlled.  2. Mild depression ([place]) Next spring will go to every other day  and then RTC 6 months from now.  3. Other hyperlipidemia   4. Need for influenza vaccination  - Flu Vaccine QUAD 6+ mos PF IM (Fluarix Quad PF)  I have done the exam and reviewed the chart and it is accurate to the best of my knowledge. Development worker, community has been used and  any errors in dictation or transcription are unintentional. Miguel Aschoff M.D. [facility]

## 2018-01-26 ENCOUNTER — Other Ambulatory Visit: Payer: Self-pay | Admitting: Family Medicine

## 2018-01-27 ENCOUNTER — Other Ambulatory Visit: Payer: Self-pay

## 2018-03-13 ENCOUNTER — Other Ambulatory Visit: Payer: Self-pay | Admitting: Family Medicine

## 2018-03-13 DIAGNOSIS — E7849 Other hyperlipidemia: Secondary | ICD-10-CM

## 2018-03-13 MED ORDER — ATORVASTATIN CALCIUM 40 MG PO TABS: 40.0000 mg | ORAL_TABLET | Freq: Every day | ORAL | 3 refills | Status: DC

## 2018-03-13 NOTE — Telephone Encounter (Signed)
[facility] faxed refill request for the following medications:  atorvastatin (LIPITOR) 40 MG tablet  90 day supply  Last Rx: 03/29/17 LOV: 01/09/18 Please advise. Thanks TNP

## 2018-03-13 NOTE — Telephone Encounter (Signed)
Please review. Thanks!  

## 2018-07-15 ENCOUNTER — Encounter: Payer: Self-pay | Admitting: Family Medicine

## 2018-07-28 ENCOUNTER — Encounter: Payer: Self-pay | Admitting: Family Medicine

## 2018-08-05 ENCOUNTER — Telehealth: Payer: Self-pay | Admitting: Family Medicine

## 2018-08-05 DIAGNOSIS — E7849 Other hyperlipidemia: Secondary | ICD-10-CM

## 2018-08-05 MED ORDER — ATORVASTATIN CALCIUM 40 MG PO TABS: 40.0000 mg | ORAL_TABLET | Freq: Every day | ORAL | 0 refills | Status: DC

## 2018-08-05 NOTE — Telephone Encounter (Signed)
Please review

## 2018-08-05 NOTE — Telephone Encounter (Signed)
ok 

## 2018-08-05 NOTE — Telephone Encounter (Signed)
Sent in medication into the pharmacy.

## 2018-08-05 NOTE — Telephone Encounter (Signed)
Needs 10 days supply of Atorvastatin 40 mg rom local pharmacy   [facility]  His mail order will not be in and he is out today  CB#  [phone]  [facility]

## 2018-08-14 ENCOUNTER — Other Ambulatory Visit: Payer: Self-pay

## 2018-08-14 ENCOUNTER — Encounter: Payer: Self-pay | Admitting: Family Medicine

## 2018-08-14 ENCOUNTER — Ambulatory Visit (INDEPENDENT_AMBULATORY_CARE_PROVIDER_SITE_OTHER): Payer: 59 | Admitting: Family Medicine

## 2018-08-14 VITALS — BP 118/74 | HR 64 | Temp 98.1°F | Resp 16 | Wt 223.2 lb

## 2018-08-14 DIAGNOSIS — M779 Enthesopathy, unspecified: Secondary | ICD-10-CM | POA: Diagnosis not present

## 2018-08-14 DIAGNOSIS — Z1211 Encounter for screening for malignant neoplasm of colon: Secondary | ICD-10-CM

## 2018-08-14 DIAGNOSIS — Z0000 Encounter for general adult medical examination without abnormal findings: Secondary | ICD-10-CM

## 2018-08-14 LAB — POCT URINALYSIS DIPSTICK
Bilirubin, UA: NEGATIVE
Blood, UA: NEGATIVE
Glucose, UA: NEGATIVE
Ketones, UA: NEGATIVE
Leukocytes, UA: NEGATIVE
Nitrite, UA: NEGATIVE
Protein, UA: NEGATIVE
Spec Grav, UA: 1.02 (ref 1.010–1.025)
Urobilinogen, UA: 0.2 E.U./dL
pH, UA: 6 (ref 5.0–8.0)

## 2018-08-14 MED ORDER — CELECOXIB 200 MG PO CAPS: 200.0000 mg | ORAL_CAPSULE | Freq: Every day | ORAL | 3 refills | Status: DC | PRN

## 2018-08-14 NOTE — Progress Notes (Signed)
Patient: Zachary Hess, Male    DOB: [birth year], 58 y.o.   MRN: [id number] Visit Date: 08/14/2018  Today's Provider: [name], MD   Chief Complaint  Patient presents with  . Annual Exam   Subjective:     Annual physical exam Zachary Hess is a 58 y.o. male who presents today for health maintenance and complete physical. He feels well. He reports exercising four days a week.Marland Kitchen He reports he is sleeping fairly well. Patient states that he is having some weakness in left hand for the past year. -----------------------------------------------------------------   Review of Systems  Constitutional: Negative.   HENT: Positive for congestion.   Eyes: Negative.   Respiratory: Negative.   Cardiovascular: Negative.   Gastrointestinal: Negative.   Endocrine: Negative.   Genitourinary: Negative.   Musculoskeletal: Positive for arthralgias.       Left thumb pain with extension.  Skin: Negative.   Allergic/Immunologic: Positive for environmental allergies.  Neurological: Negative.   Hematological: Negative.   Psychiatric/Behavioral: Negative.   All other systems reviewed and are negative.   Social History      He  reports that he has never smoked. He has never used smokeless tobacco. He reports current alcohol use of about 2.0 standard drinks of alcohol per week. He reports that he does not use drugs.       Social History   Socioeconomic History  . Marital status: Married    Spouse name: Not on file  . Number of children: Not on file  . Years of education: Not on file  . Highest education level: Not on file  Occupational History  . Not on file  Social Needs  . Financial resource strain: Not on file  . Food insecurity    Worry: Not on file    Inability: Not on file  . Transportation needs    Medical: Not on file    Non-medical: Not on file  Tobacco Use  . Smoking status: Never Smoker  . Smokeless tobacco: Never Used  Substance and Sexual Activity  .  Alcohol use: Yes    Alcohol/week: 2.0 standard drinks    Types: 2 Standard drinks or equivalent per week    Comment: SOCIALLY  . Drug use: No  . Sexual activity: Not on file  Lifestyle  . Physical activity    Days per week: Not on file    Minutes per session: Not on file  . Stress: Not on file  Relationships  . Social Herbalist on phone: Not on file    Gets together: Not on file    Attends religious service: Not on file    Active member of club or organization: Not on file    Attends meetings of clubs or organizations: Not on file    Relationship status: Not on file  Other Topics Concern  . Not on file  Social History Narrative  . Not on file    Past Medical History:  Diagnosis Date  . Anxiety   . HBP (high blood pressure)   . Sleep apnea 01/03/2017     Patient Active Problem List   Diagnosis Date Noted  . Sleep apnea 01/03/2017  . Adaptation reaction 11/29/2014  . Body mass index (BMI) of 26.0-26.9 in adult 11/29/2014  . Essential (primary) hypertension 11/29/2014  . HLD (hyperlipidemia) 11/29/2014  . Mild depression ([place]) 11/29/2014  . Clinical depression 11/29/2014  . Allergic rhinitis, seasonal 11/29/2014  . Avitaminosis  D 11/29/2014  . HYPERLIPIDEMIA-MIXED 05/10/2009  . DEPRESSION, MILD 05/10/2009  . HYPERTENSION, BENIGN 05/10/2009    Past Surgical History:  Procedure Laterality Date  . ANAL FISSURE REPAIR    . FOOT SURGERY Left   . MANDIBLE FRACTURE SURGERY    . MOUTH SURGERY     WISDOM TEETH    Family History        Family Status  Relation Name Status  . Father  Deceased at age 94  . Mother  Alive  . Sister  Alive  . Brother  Alive  . Annamarie Major  Deceased  . MGM  Deceased at age 22  . Brother  Alive  . PGF  (Not Specified)        His family history includes Atrial fibrillation in his mother; CAD in his father; Congestive Heart Failure in his father; Diabetes in his mother; Heart attack in his father and paternal uncle; Heart  disease in his maternal grandmother; Stroke in his paternal grandfather.      No Known Allergies   Current Outpatient Medications:  .  Ascorbic Acid (VITAMIN C) 1000 MG tablet, Take 1,000 mg by mouth daily., Disp: , Rfl:  .  aspirin 81 MG tablet, Take by mouth., Disp: , Rfl:  .  atorvastatin (LIPITOR) 40 MG tablet, Take 1 tablet (40 mg total) by mouth at bedtime., Disp: 10 tablet, Rfl: 0 .  Cholecalciferol 1000 UNITS tablet, Take by mouth., Disp: , Rfl:  .  Coenzyme Q10 (COQ10) 100 MG CAPS, Take by mouth., Disp: , Rfl:  .  fluticasone (FLONASE) 50 MCG/ACT nasal spray, Place into the nose., Disp: , Rfl:  .  Loratadine (CLARITIN PO), Take by mouth daily., Disp: , Rfl:  .  losartan (COZAAR) 50 MG tablet, TAKE 1 TABLET DAILY, Disp: 90 tablet, Rfl: 3 .  losartan (COZAAR) 50 MG tablet, TAKE 1 TABLET DAILY, Disp: 90 tablet, Rfl: 3 .  triamcinolone cream (KENALOG) 0.1 %, , Disp: , Rfl:  .  venlafaxine XR (EFFEXOR-XR) 37.5 MG 24 hr capsule, TAKE 1 CAPSULE DAILY, Disp: 90 capsule, Rfl: 3   Patient Care Team: Jerrol Banana., MD as PCP - General (Family Medicine)    Objective:    Vitals: BP 118/74 (BP Location: Right Arm, Patient Position: Sitting, Cuff Size: Normal)   Pulse 64   Temp 98.1 F (36.7 C) (Oral)   Resp 16   Wt 223 lb 3.2 oz (101.2 kg)   SpO2 97%   BMI 27.90 kg/m    Vitals:   08/14/18 1439  BP: 118/74  Pulse: 64  Resp: 16  Temp: 98.1 F (36.7 C)  TempSrc: Oral  SpO2: 97%  Weight: 223 lb 3.2 oz (101.2 kg)     Physical Exam Vitals signs reviewed.  Constitutional:      Appearance: He is well-developed.  HENT:     Head: Normocephalic and atraumatic.     Right Ear: External ear normal.     Left Ear: External ear normal.     Nose: Nose normal.  Eyes:     Conjunctiva/sclera: Conjunctivae normal.     Pupils: Pupils are equal, round, and reactive to light.  Neck:     Musculoskeletal: Normal range of motion and neck supple.  Cardiovascular:     Rate and  Rhythm: Normal rate and regular rhythm.     Heart sounds: Normal heart sounds.  Pulmonary:     Effort: Pulmonary effort is normal.     Breath sounds: Normal  breath sounds.  Abdominal:     General: Bowel sounds are normal.     Palpations: Abdomen is soft.  Genitourinary:    Penis: Normal.      Prostate: Normal.     Rectum: Normal.  Musculoskeletal: Normal range of motion.  Skin:    General: Skin is warm and dry.     Comments: Apparent AKs of forehead.  Neurological:     Mental Status: He is alert and oriented to person, place, and time.  Psychiatric:        Mood and Affect: Mood normal.        Behavior: Behavior normal.        Thought Content: Thought content normal.        Judgment: Judgment normal.      Depression Screen PHQ 2/9 Scores 08/14/2018 08/14/2018 01/09/2018 07/10/2017  PHQ - 2 Score 0 0 0 0  PHQ- 9 Score 0 - 0 0       Assessment & Plan:     Routine Health Maintenance and Physical Exam  Exercise Activities and Dietary recommendations Goals   None     Immunization History  Administered Date(s) Administered  . Hepatitis A 01/24/2011, 03/13/2011, 07/24/2011  . Hepatitis B 01/24/2011, 03/13/2011, 07/24/2011  . Influenza Split 02/25/2012  . Influenza,inj,Quad PF,6+ Mos 01/21/2013, 11/25/2013, 11/29/2014, 12/13/2015, 01/03/2017, 01/09/2018  . Td 10/04/2003  . Tdap 11/25/2013  . Typhoid Inactivated 03/11/2011    Health Maintenance  Topic Date Due  . HIV Screening  05/27/1973  . COLONOSCOPY  06/08/2018  . INFLUENZA VACCINE  09/27/2018  . TETANUS/TDAP  11/26/2023  . Hepatitis C Screening  Completed     Discussed health benefits of physical activity, and encouraged him to engage in regular exercise appropriate for his age and condition.    --------------------------------------------------------------------  1. Annual physical exam  - Comp. Metabolic Panel (12) - PSA - TSH - Lipid Profile - CBC w/Diff/Platelet - POCT urinalysis dipstick   2. Tendonitis Of thumb--try NSAID--may need referral to [facility]. - celecoxib (CELEBREX) 200 MG capsule; Take 1 capsule (200 mg total) by mouth daily as needed for mild pain.  Dispense: 30 capsule; Refill: 3  3. Encounter for screening colonoscopy  - Ambulatory referral to Gastroenterology  I have done the exam and reviewed the above chart and it is accurate to the best of my knowledge. Development worker, community has been used in this note in any air is in the dictation or transcription are unintentional.  [name], MD  [place]

## 2018-08-21 ENCOUNTER — Telehealth: Payer: Self-pay

## 2018-08-21 LAB — CBC WITH DIFFERENTIAL/PLATELET
Basophils Absolute: 0 10*3/uL (ref 0.0–0.2)
Basos: 1 %
EOS (ABSOLUTE): 0.2 10*3/uL (ref 0.0–0.4)
Eos: 4 %
Hematocrit: 40 % (ref 37.5–51.0)
Hemoglobin: 13.6 g/dL (ref 13.0–17.7)
Immature Grans (Abs): 0 10*3/uL (ref 0.0–0.1)
Immature Granulocytes: 0 %
Lymphocytes Absolute: 1.9 10*3/uL (ref 0.7–3.1)
Lymphs: 31 %
MCH: 30.4 pg (ref 26.6–33.0)
MCHC: 34 g/dL (ref 31.5–35.7)
MCV: 89 fL (ref 79–97)
Monocytes Absolute: 0.6 10*3/uL (ref 0.1–0.9)
Monocytes: 10 %
Neutrophils Absolute: 3.4 10*3/uL (ref 1.4–7.0)
Neutrophils: 54 %
Platelets: 243 10*3/uL (ref 150–450)
RBC: 4.48 x10E6/uL (ref 4.14–5.80)
RDW: 12.3 % (ref 11.6–15.4)
WBC: 6.2 10*3/uL (ref 3.4–10.8)

## 2018-08-21 LAB — LIPID PANEL
Chol/HDL Ratio: 2.9 ratio (ref 0.0–5.0)
Cholesterol, Total: 160 mg/dL (ref 100–199)
HDL: 56 mg/dL (ref >39)
LDL Calculated: 77 mg/dL (ref 0–99)
Triglycerides: 137 mg/dL (ref 0–149)
VLDL Cholesterol Cal: 27 mg/dL (ref 5–40)

## 2018-08-21 LAB — COMP. METABOLIC PANEL (12)
AST: 33 IU/L (ref 0–40)
Albumin/Globulin Ratio: 2.1 (ref 1.2–2.2)
Albumin: 4.8 g/dL (ref 3.8–4.9)
Alkaline Phosphatase: 69 IU/L (ref 39–117)
BUN/Creatinine Ratio: 13 (ref 10–24)
BUN: 14 mg/dL (ref 8–27)
Bilirubin Total: 0.9 mg/dL (ref 0.0–1.2)
Calcium: 9.9 mg/dL (ref 8.6–10.2)
Chloride: 99 mmol/L (ref 96–106)
Creatinine, Ser: 1.07 mg/dL (ref 0.76–1.27)
GFR calc Af Amer: 87 mL/min/{1.73_m2} (ref >59)
GFR calc non Af Amer: 75 mL/min/{1.73_m2} (ref >59)
Globulin, Total: 2.3 g/dL (ref 1.5–4.5)
Glucose: 104 mg/dL — ABNORMAL HIGH (ref 65–99)
Potassium: 4.4 mmol/L (ref 3.5–5.2)
Sodium: 138 mmol/L (ref 134–144)
Total Protein: 7.1 g/dL (ref 6.0–8.5)

## 2018-08-21 LAB — TSH: TSH: 6.77 u[IU]/mL — ABNORMAL HIGH (ref 0.450–4.500)

## 2018-08-21 LAB — PSA: Prostate Specific Ag, Serum: 1.4 ng/mL (ref 0.0–4.0)

## 2018-08-21 MED ORDER — LEVOTHYROXINE SODIUM 25 MCG PO TABS: 25.0000 ug | ORAL_TABLET | Freq: Every day | ORAL | 6 refills | Status: DC

## 2018-08-21 NOTE — Telephone Encounter (Signed)
Patient willing to start on synthroid 11mcg daily. This was sent into the pharmacy. Patient will call in 104mos for repeat TSH.

## 2018-08-21 NOTE — Telephone Encounter (Signed)
-----   Message from Jerrol Banana., MD sent at 08/21/2018  7:53 AM EDT ----- Hypothyroid--either repeat TSH 3 months or Start synthroid 25 mcg daily and then RTC 3 months

## 2018-08-25 ENCOUNTER — Other Ambulatory Visit: Payer: Self-pay

## 2018-08-25 ENCOUNTER — Telehealth: Payer: Self-pay

## 2018-08-25 DIAGNOSIS — Z1211 Encounter for screening for malignant neoplasm of colon: Secondary | ICD-10-CM

## 2018-08-25 NOTE — Telephone Encounter (Signed)
Gastroenterology Pre-Procedure Review  Request Date: 10/07/18 Requesting Physician: Dr. Vicente Males  PATIENT REVIEW QUESTIONS: The patient responded to the following health history questions as indicated:    1. Are you having any GI issues? no 2. Do you have a personal history of Polyps? No 3. Do you have a family history of Colon Cancer or Polyps? no 4. Diabetes Mellitus? no 5. Joint replacements in the past 12 months?no 6. Major health problems in the past 3 months?no 7. Any artificial heart valves, MVP, or defibrillator?no    MEDICATIONS & ALLERGIES:    Patient reports the following regarding taking any anticoagulation/antiplatelet therapy:   Plavix, Coumadin, Eliquis, Xarelto, Lovenox, Pradaxa, Brilinta, or Effient? no Aspirin? yes (81 mg)  Patient confirms/reports the following medications:  Current Outpatient Medications  Medication Sig Dispense Refill  . Ascorbic Acid (VITAMIN C) 1000 MG tablet Take 1,000 mg by mouth daily.    Marland Kitchen aspirin 81 MG tablet Take by mouth.    Marland Kitchen atorvastatin (LIPITOR) 40 MG tablet Take 1 tablet (40 mg total) by mouth at bedtime. 10 tablet 0  . celecoxib (CELEBREX) 200 MG capsule Take 1 capsule (200 mg total) by mouth daily as needed for mild pain. 30 capsule 3  . Cholecalciferol 1000 UNITS tablet Take by mouth.    . Coenzyme Q10 (COQ10) 100 MG CAPS Take by mouth.    . fluticasone (FLONASE) 50 MCG/ACT nasal spray Place into the nose.    . levothyroxine (SYNTHROID) 25 MCG tablet Take 1 tablet (25 mcg total) by mouth daily before breakfast. 30 tablet 6  . Loratadine (CLARITIN PO) Take by mouth daily.    Marland Kitchen losartan (COZAAR) 50 MG tablet TAKE 1 TABLET DAILY 90 tablet 3  . losartan (COZAAR) 50 MG tablet TAKE 1 TABLET DAILY 90 tablet 3  . triamcinolone cream (KENALOG) 0.1 %     . venlafaxine XR (EFFEXOR-XR) 37.5 MG 24 hr capsule TAKE 1 CAPSULE DAILY 90 capsule 3   No current facility-administered medications for this visit.     Patient confirms/reports the  following allergies:  No Known Allergies  No orders of the defined types were placed in this encounter.   AUTHORIZATION INFORMATION Primary Insurance: 1D#: Group #:  Secondary Insurance: 1D#: Group #:  SCHEDULE INFORMATION: Date: 10/07/18 Time: Location:[facility]

## 2018-09-22 ENCOUNTER — Telehealth: Payer: Self-pay | Admitting: Gastroenterology

## 2018-09-22 NOTE — Telephone Encounter (Signed)
Patient called & l/m stating he would be helping his daughter move so he needs to r/s his colonoscopy on 10-07-18 becAUSE he will not be able to qurintine after having the Covid test on 10-03-18. Please call to r/s

## 2018-09-22 NOTE — Telephone Encounter (Signed)
Patient has been advised that as long as he continues to practices safety measures (mask, hand hygiene, social distancing, avoid crowds) he can keep his Colonoscopy as scheduled.  He said he will.  Thanks [employer]

## 2018-10-03 ENCOUNTER — Other Ambulatory Visit: Payer: Self-pay

## 2018-10-03 ENCOUNTER — Other Ambulatory Visit
Admission: RE | Admit: 2018-10-03 | Discharge: 2018-10-03 | Disposition: A | Discharge status: Home / Self Care | Payer: 59 | Source: Ambulatory Visit | Attending: Gastroenterology | Admitting: Gastroenterology

## 2018-10-03 DIAGNOSIS — Z01812 Encounter for preprocedural laboratory examination: Secondary | ICD-10-CM | POA: Insufficient documentation

## 2018-10-03 DIAGNOSIS — Z20828 Contact with and (suspected) exposure to other viral communicable diseases: Secondary | ICD-10-CM | POA: Insufficient documentation

## 2018-10-04 LAB — SARS CORONAVIRUS 2 (TAT 6-24 HRS): SARS Coronavirus 2: NEGATIVE

## 2018-10-07 ENCOUNTER — Ambulatory Visit: Payer: No Typology Code available for payment source | Admitting: Certified Registered"

## 2018-10-07 ENCOUNTER — Encounter
Admission: RE | Disposition: A | Discharge status: Home / Self Care | Payer: Self-pay | Source: Home / Self Care | Attending: Gastroenterology

## 2018-10-07 ENCOUNTER — Other Ambulatory Visit: Payer: Self-pay

## 2018-10-07 ENCOUNTER — Ambulatory Visit
Admission: RE | Admit: 2018-10-07 | Discharge: 2018-10-07 | Disposition: A | Discharge status: Home / Self Care | Payer: No Typology Code available for payment source | Attending: Gastroenterology | Admitting: Gastroenterology

## 2018-10-07 DIAGNOSIS — Z7982 Long term (current) use of aspirin: Secondary | ICD-10-CM | POA: Diagnosis not present

## 2018-10-07 DIAGNOSIS — F419 Anxiety disorder, unspecified: Secondary | ICD-10-CM | POA: Diagnosis not present

## 2018-10-07 DIAGNOSIS — Z1211 Encounter for screening for malignant neoplasm of colon: Secondary | ICD-10-CM | POA: Insufficient documentation

## 2018-10-07 DIAGNOSIS — D123 Benign neoplasm of transverse colon: Secondary | ICD-10-CM | POA: Diagnosis not present

## 2018-10-07 DIAGNOSIS — Z79899 Other long term (current) drug therapy: Secondary | ICD-10-CM | POA: Insufficient documentation

## 2018-10-07 DIAGNOSIS — G4730 Sleep apnea, unspecified: Secondary | ICD-10-CM | POA: Insufficient documentation

## 2018-10-07 DIAGNOSIS — F329 Major depressive disorder, single episode, unspecified: Secondary | ICD-10-CM | POA: Insufficient documentation

## 2018-10-07 DIAGNOSIS — Z79890 Hormone replacement therapy (postmenopausal): Secondary | ICD-10-CM | POA: Insufficient documentation

## 2018-10-07 DIAGNOSIS — D122 Benign neoplasm of ascending colon: Secondary | ICD-10-CM

## 2018-10-07 DIAGNOSIS — I10 Essential (primary) hypertension: Secondary | ICD-10-CM | POA: Diagnosis not present

## 2018-10-07 HISTORY — PX: COLONOSCOPY WITH PROPOFOL: SHX5780

## 2018-10-07 SURGERY — COLONOSCOPY WITH PROPOFOL
Anesthesia: General

## 2018-10-07 MED ORDER — PROPOFOL 500 MG/50ML IV EMUL
INTRAVENOUS | Status: AC
  Filled 2018-10-07: qty 50

## 2018-10-07 MED ORDER — PHENYLEPHRINE HCL (PRESSORS) 10 MG/ML IV SOLN
INTRAVENOUS | Status: AC
  Filled 2018-10-07: qty 1

## 2018-10-07 MED ORDER — PROPOFOL 500 MG/50ML IV EMUL
INTRAVENOUS | Status: DC | PRN
  Administered 2018-10-07: 145 ug/kg/min via INTRAVENOUS

## 2018-10-07 MED ORDER — SODIUM CHLORIDE 0.9 % IV SOLN
INTRAVENOUS | Status: DC
  Administered 2018-10-07: 08:00:00 via INTRAVENOUS

## 2018-10-07 MED ORDER — PROPOFOL 10 MG/ML IV BOLUS
INTRAVENOUS | Status: DC | PRN
  Administered 2018-10-07: 70 mg via INTRAVENOUS

## 2018-10-07 NOTE — Anesthesia Preprocedure Evaluation (Signed)
Anesthesia Evaluation  Patient identified by MRN, date of birth, ID band Patient awake    Reviewed: Allergy & Precautions, NPO status , Patient's Chart, lab work & pertinent test results  Airway Mallampati: II  TM Distance: >3 FB     Dental no notable dental hx.    Pulmonary sleep apnea ,    Pulmonary exam normal        Cardiovascular hypertension, Normal cardiovascular exam     Neuro/Psych PSYCHIATRIC DISORDERS Anxiety Depression    GI/Hepatic Neg liver ROS,   Endo/Other  negative endocrine ROS  Renal/GU negative Renal ROS     Musculoskeletal negative musculoskeletal ROS (+)   Abdominal Normal abdominal exam  (+)   Peds  Hematology negative hematology ROS (+)   Anesthesia Other Findings   Reproductive/Obstetrics                             Anesthesia Physical Anesthesia Plan  ASA: II  Anesthesia Plan: General   Post-op Pain Management:    Induction: Intravenous  PONV Risk Score and Plan:   Airway Management Planned: Nasal Cannula  Additional Equipment:   Intra-op Plan:   Post-operative Plan:   Informed Consent: I have reviewed the patients History and Physical, chart, labs and discussed the procedure including the risks, benefits and alternatives for the proposed anesthesia with the patient or authorized representative who has indicated his/her understanding and acceptance.     Dental advisory given  Plan Discussed with: CRNA and Surgeon  Anesthesia Plan Comments:         Anesthesia Quick Evaluation

## 2018-10-07 NOTE — H&P (Signed)
Zachary Bellows, MD [address] [place], [place], [place], [address] [address], [place], [place], [place], [address] Phone: [phone]  Fax: [phone]  Primary Care Physician:  Zachary Hess., MD   Pre-Procedure History & Physical: HPI:  Zachary Hess is a 58 y.o. male is here for an colonoscopy.   Past Medical History:  Diagnosis Date  . Anxiety   . HBP (high blood pressure)   . Sleep apnea 01/03/2017    Past Surgical History:  Procedure Laterality Date  . ANAL FISSURE REPAIR    . FOOT SURGERY Left   . MANDIBLE FRACTURE SURGERY    . MOUTH SURGERY     WISDOM TEETH    Prior to Admission medications   Medication Sig Start Date End Date Taking? Authorizing Provider  Ascorbic Acid (VITAMIN C) 1000 MG tablet Take 1,000 mg by mouth daily.   Yes [provider]  aspirin 81 MG tablet Take by mouth.   Yes [provider]  atorvastatin (LIPITOR) 40 MG tablet Take 1 tablet (40 mg total) by mouth at bedtime. 08/05/18  Yes Zachary Hess., MD  Cholecalciferol 1000 UNITS tablet Take by mouth.   Yes [provider]  Coenzyme Q10 (COQ10) 100 MG CAPS Take by mouth. 05/11/10  Yes [provider]  fluticasone (FLONASE) 50 MCG/ACT nasal spray Place into the nose.   Yes [provider]  levothyroxine (SYNTHROID) 25 MCG tablet Take 1 tablet (25 mcg total) by mouth daily before breakfast. 08/21/18  Yes Zachary Hess., MD  Loratadine (CLARITIN PO) Take by mouth daily.   Yes [provider]  losartan (COZAAR) 50 MG tablet TAKE 1 TABLET DAILY 02/11/17  Yes Zachary Hess., MD  losartan (COZAAR) 50 MG tablet TAKE 1 TABLET DAILY 01/27/18  Yes Zachary Hess., MD  venlafaxine XR (EFFEXOR-XR) 37.5 MG 24 hr capsule TAKE 1 CAPSULE DAILY 12/25/17  Yes Zachary Hess., MD  celecoxib (CELEBREX) 200 MG capsule Take 1 capsule (200 mg total) by mouth daily as needed for mild pain. Patient not taking:  Reported on 10/07/2018 08/14/18   Zachary Hess., MD  triamcinolone cream (KENALOG) 0.1 %  06/02/16   [provider]    Allergies as of 08/25/2018  . (No Known Allergies)    Family History  Problem Relation Age of Onset  . Congestive Heart Failure Father   . CAD Father   . Heart attack Father   . Diabetes Mother   . Atrial fibrillation Mother   . Heart attack Paternal Uncle   . Heart disease Maternal Grandmother   . Stroke Paternal Grandfather     Social History   Socioeconomic History  . Marital status: Married    Spouse name: Not on file  . Number of children: Not on file  . Years of education: Not on file  . Highest education level: Not on file  Occupational History  . Not on file  Social Needs  . Financial resource strain: Not on file  . Food insecurity    Worry: Not on file    Inability: Not on file  . Transportation needs    Medical: Not on file    Non-medical: Not on file  Tobacco Use  . Smoking status: Never Smoker  . Smokeless tobacco: Never Used  Substance and Sexual Activity  . Alcohol use: Yes    Alcohol/week: 2.0 standard drinks    Types: 2  Standard drinks or equivalent per week    Comment: SOCIALLY  . Drug use: No  . Sexual activity: Not on file  Lifestyle  . Physical activity    Days per week: Not on file    Minutes per session: Not on file  . Stress: Not on file  Relationships  . Social Herbalist on phone: Not on file    Gets together: Not on file    Attends religious service: Not on file    Active member of club or organization: Not on file    Attends meetings of clubs or organizations: Not on file    Relationship status: Not on file  . Intimate partner violence    Fear of current or ex partner: Not on file    Emotionally abused: Not on file    Physically abused: Not on file    Forced sexual activity: Not on file  Other Topics Concern  . Not on file  Social History Narrative  . Not on file    Review of  Systems: See HPI, otherwise negative ROS  Physical Exam: BP 116/79   Pulse 65   Temp (!) 97.2 F (36.2 C) (Tympanic)   Resp 17   Ht 6\' 3"  (1.905 m)   Wt 94.8 kg   SpO2 100%   BMI 26.12 kg/m  General:   Alert,  pleasant and cooperative in NAD Head:  Normocephalic and atraumatic. Neck:  Supple; no masses or thyromegaly. Lungs:  Clear throughout to auscultation, normal respiratory effort.    Heart:  +S1, +S2, Regular rate and rhythm, No edema. Abdomen:  Soft, nontender and nondistended. Normal bowel sounds, without guarding, and without rebound.   Neurologic:  Alert and  oriented x4;  grossly normal neurologically.  Impression/Plan: Zachary Hess is here for an colonoscopy to be performed for Screening colonoscopy average risk   Risks, benefits, limitations, and alternatives regarding  colonoscopy have been reviewed with the patient.  Questions have been answered.  All parties agreeable.   Zachary Bellows, MD  10/07/2018, 8:13 AM

## 2018-10-07 NOTE — Op Note (Signed)
[facility] Patient Name: Zachary Hess Procedure Date: 10/07/2018 7:30 AM MRN: [id number] Account #: [id number] Date of Birth: 08/05/1960 Admit Type: Outpatient Age: 58 Room: [facility] ENDO ROOM 1 Gender: Male Note Status: Finalized Procedure:            Colonoscopy Indications:          Screening for colorectal malignant neoplasm Providers:            [name] MD, MD Referring MD:         Janine Ores. Rosanna Randy, MD (Referring MD) Medicines:            Monitored Anesthesia Care Complications:        No immediate complications. Procedure:            Pre-Anesthesia Assessment:                       - Prior to the procedure, a History and Physical was                        performed, and patient medications, allergies and                        sensitivities were reviewed. The patient's tolerance of                        previous anesthesia was reviewed.                       - The risks and benefits of the procedure and the                        sedation options and risks were discussed with the                        patient. All questions were answered and informed                        consent was obtained.                       - ASA Grade Assessment: II - A patient with mild                        systemic disease.                       After obtaining informed consent, the colonoscope was                        passed under direct vision. Throughout the procedure,                        the patient's blood pressure, pulse, and oxygen                        saturations were monitored continuously. The                        Colonoscope was introduced through the anus and  advanced to the the cecum, identified by the                        appendiceal orifice, IC valve and transillumination.                        The colonoscopy was performed with ease. The patient                        tolerated the procedure well. The  quality of the bowel                        preparation was excellent. Findings:      The perianal and digital rectal examinations were normal.      Two sessile polyps were found in the ascending colon. The polyps were 4       to 5 mm in size. These polyps were removed with a cold snare. Resection       and retrieval were complete.      A 6 mm polyp was found in the transverse colon. The polyp was sessile.       The polyp was removed with a cold snare. Resection and retrieval were       complete.      The exam was otherwise without abnormality on direct and retroflexion       views. Impression:           - Two 4 to 5 mm polyps in the ascending colon, removed                        with a cold snare. Resected and retrieved.                       - One 6 mm polyp in the transverse colon, removed with                        a cold snare. Resected and retrieved.                       - The examination was otherwise normal on direct and                        retroflexion views. Recommendation:       - Discharge patient to home (with escort).                       - Resume previous diet.                       - Repeat colonoscopy for surveillance based on                        pathology results. Procedure Code(s):    --- Professional ---                       [phone], Colonoscopy, flexible; with removal of tumor(s),                        polyp(s), or other lesion(s) by snare technique Diagnosis Code(s):    --- Professional ---  K63.5, Polyp of colon                       Z12.11, Encounter for screening for malignant neoplasm                        of colon CPT copyright 2019 American Medical Association. All rights reserved. The codes documented in this report are preliminary and upon coder review may  be revised to meet current compliance requirements. [name], MD [name] MD, MD 10/07/2018 8:37:55 AM This report has been signed electronically. Number of  Addenda: 0 Note Initiated On: 10/07/2018 7:30 AM Scope Withdrawal Time: 0 hours 15 minutes 19 seconds  Total Procedure Duration: 0 hours 17 minutes 44 seconds  Estimated Blood Loss: Estimated blood loss: none.      [facility]

## 2018-10-07 NOTE — Transfer of Care (Signed)
Immediate Anesthesia Transfer of Care Note  Patient: Zachary Hess  Procedure(s) Performed: COLONOSCOPY WITH PROPOFOL (N/A )  Patient Location: PACU  Anesthesia Type:General  Level of Consciousness: awake, alert  and oriented  Airway & Oxygen Therapy: Patient Spontanous Breathing  Post-op Assessment: Report given to RN and Post -op Vital signs reviewed and stable  Post vital signs: Reviewed and stable  Last Vitals:  Vitals Value Taken Time  BP 96/68 10/07/18 0840  Temp    Pulse 55 10/07/18 0840  Resp 16 10/07/18 0840  SpO2 100 % 10/07/18 0840  Vitals shown include unvalidated device data.  Last Pain:  Vitals:   10/07/18 0748  TempSrc: Tympanic  PainSc: 0-No pain         Complications: No apparent anesthesia complications

## 2018-10-07 NOTE — Anesthesia Postprocedure Evaluation (Signed)
Anesthesia Post Note  Patient: Zachary Hess  Procedure(s) Performed: COLONOSCOPY WITH PROPOFOL (N/A )  Patient location during evaluation: Endoscopy Anesthesia Type: General Level of consciousness: awake and alert and oriented Pain management: pain level controlled Vital Signs Assessment: post-procedure vital signs reviewed and stable Respiratory status: spontaneous breathing Cardiovascular status: blood pressure returned to baseline Anesthetic complications: no     Last Vitals:  Vitals:   10/07/18 0900 10/07/18 0910  BP: 104/61 100/65  Pulse: (!) 54 (!) 51  Resp: 18 16  Temp:    SpO2: 100% 100%    Last Pain:  Vitals:   10/07/18 0748  TempSrc: Tympanic  PainSc: 0-No pain                 Shailey Butterbaugh

## 2018-10-07 NOTE — Anesthesia Post-op Follow-up Note (Signed)
Anesthesia QCDR form completed.        

## 2018-10-08 ENCOUNTER — Encounter: Payer: Self-pay | Admitting: Gastroenterology

## 2018-10-08 LAB — SURGICAL PATHOLOGY

## 2018-10-12 ENCOUNTER — Encounter: Payer: Self-pay | Admitting: Gastroenterology

## 2018-11-10 ENCOUNTER — Telehealth: Payer: Self-pay | Admitting: Family Medicine

## 2018-11-10 NOTE — Telephone Encounter (Signed)
Pt called saying since he has been taking the   Levothyroxine 25 MCG is has notice joint and muscle pain  CB#  [phone]  Con [place]

## 2018-11-10 NOTE — Telephone Encounter (Signed)
Is this a common reaction to taking levothyroxine? Please advise.

## 2018-11-10 NOTE — Telephone Encounter (Signed)
No,but he can stop it and see how quickly it clears.

## 2018-11-12 NOTE — Telephone Encounter (Signed)
Patient notified to stop medication for a few days to see how he feels. He states that he is not as sore today,and has been taking the medication with more water. He will call back on Friday to let you know how he is feeling.

## 2018-11-12 NOTE — Telephone Encounter (Signed)
LMTCB

## 2018-12-08 ENCOUNTER — Other Ambulatory Visit: Payer: Self-pay | Admitting: Family Medicine

## 2019-01-09 ENCOUNTER — Other Ambulatory Visit: Payer: Self-pay | Admitting: Family Medicine

## 2019-02-12 ENCOUNTER — Telehealth: Payer: Self-pay | Admitting: Family Medicine

## 2019-02-12 DIAGNOSIS — E7849 Other hyperlipidemia: Secondary | ICD-10-CM

## 2019-02-12 NOTE — Telephone Encounter (Signed)
Pt called and stated that he would like to know if a week supply of atorvastatin (LIPITOR) 40 MG tablet [id number]  could be called in. Pt states that he has still not received medication in the mail from mail service. Pt states that he has taken his last pill today. Please advise   [id number] Lorina Rabon, [place] Phone:  [phone]  Fax:  [phone]

## 2019-02-12 NOTE — Telephone Encounter (Signed)
Ok to send in 30 day supply--thx

## 2019-02-13 MED ORDER — ATORVASTATIN CALCIUM 40 MG PO TABS: 40.0000 mg | ORAL_TABLET | Freq: Every day | ORAL | 0 refills | Status: DC

## 2019-02-13 MED ORDER — ATORVASTATIN CALCIUM 40 MG PO TABS: 40.0000 mg | ORAL_TABLET | Freq: Every day | ORAL | 1 refills | Status: DC

## 2019-02-13 NOTE — Telephone Encounter (Signed)
Rx sent to pharmacy   

## 2019-03-19 ENCOUNTER — Ambulatory Visit (INDEPENDENT_AMBULATORY_CARE_PROVIDER_SITE_OTHER): Payer: No Typology Code available for payment source | Admitting: Physician Assistant

## 2019-03-19 ENCOUNTER — Encounter: Payer: Self-pay | Admitting: Physician Assistant

## 2019-03-19 DIAGNOSIS — R7989 Other specified abnormal findings of blood chemistry: Secondary | ICD-10-CM

## 2019-03-19 DIAGNOSIS — H9313 Tinnitus, bilateral: Secondary | ICD-10-CM

## 2019-03-19 NOTE — Patient Instructions (Signed)

## 2019-03-19 NOTE — Progress Notes (Signed)
Patient: Zachary Hess Male    DOB: [birth year]   59 y.o.   MRN: [id number] Visit Date: 03/19/2019  Today's Provider: Trinna Post, PA-C   Chief Complaint  Patient presents with  . Tinnitus   Subjective:    Virtual Visit via Telephone Note  I connected with Zachary Hess on 03/19/19 at 11:20 AM EST by telephone and verified that I am speaking with the correct person using two identifiers.  Location: Patient: Home Provider: Office   I discussed the limitations, risks, security and privacy concerns of performing an evaluation and management service by telephone and the availability of in person appointments. I also discussed with the patient that there may be a patient responsible charge related to this service. The patient expressed understanding and agreed to proceed.   Ear Fullness  There is pain in both ears. This is a new problem. The current episode started more than 1 month ago. The problem occurs constantly. The problem has been unchanged. There has been no fever. Associated symptoms include rhinorrhea. The treatment provided no relief.  Reports he has had tinnitus at a low level for several years and has been worsening over the past year. Reports he can ignore it if there is noise in the background.   Patient was started on levothyroxine 25 mcg daily started by his PCP due to elevated TSH. He is currently taking 12.5 mcg daily. Would like to recheck this.   Lab Results  Component Value Date   TSH 6.770 (H) 08/20/2018    No Known Allergies   Current Outpatient Medications:  .  Ascorbic Acid (VITAMIN C) 1000 MG tablet, Take 1,000 mg by mouth daily., Disp: , Rfl:  .  aspirin 81 MG tablet, Take by mouth., Disp: , Rfl:  .  atorvastatin (LIPITOR) 40 MG tablet, Take 1 tablet (40 mg total) by mouth at bedtime., Disp: 10 tablet, Rfl: 0 .  celecoxib (CELEBREX) 200 MG capsule, Take 1 capsule (200 mg total) by mouth daily as needed for mild pain. (Patient not taking:  Reported on 10/07/2018), Disp: 30 capsule, Rfl: 3 .  Cholecalciferol 1000 UNITS tablet, Take by mouth., Disp: , Rfl:  .  Coenzyme Q10 (COQ10) 100 MG CAPS, Take by mouth., Disp: , Rfl:  .  fluticasone (FLONASE) 50 MCG/ACT nasal spray, Place into the nose., Disp: , Rfl:  .  levothyroxine (SYNTHROID) 25 MCG tablet, Take 1 tablet (25 mcg total) by mouth daily before breakfast., Disp: 30 tablet, Rfl: 6 .  Loratadine (CLARITIN PO), Take by mouth daily., Disp: , Rfl:  .  losartan (COZAAR) 50 MG tablet, TAKE 1 TABLET DAILY, Disp: 90 tablet, Rfl: 3 .  losartan (COZAAR) 50 MG tablet, TAKE 1 TABLET DAILY, Disp: 90 tablet, Rfl: 3 .  losartan (COZAAR) 50 MG tablet, TAKE 1 TABLET DAILY, Disp: 90 tablet, Rfl: 3 .  triamcinolone cream (KENALOG) 0.1 %, , Disp: , Rfl:  .  venlafaxine XR (EFFEXOR-XR) 37.5 MG 24 hr capsule, TAKE 1 CAPSULE DAILY, Disp: 90 capsule, Rfl: 3  Review of Systems  Constitutional: Negative.   HENT: Positive for rhinorrhea.   Eyes: Negative.   Respiratory: Negative.   Gastrointestinal: Negative.   Endocrine: Negative.   Genitourinary: Negative.   Musculoskeletal: Negative.   Skin: Negative.   Allergic/Immunologic: Negative.   Neurological: Negative.   Hematological: Negative.   Psychiatric/Behavioral: Negative.     Social History   Tobacco Use  . Smoking status: Never Smoker  .  Smokeless tobacco: Never Used  Substance Use Topics  . Alcohol use: Yes    Alcohol/week: 2.0 standard drinks    Types: 2 Standard drinks or equivalent per week    Comment: SOCIALLY      Objective:   There were no vitals taken for this visit. There were no vitals filed for this visit.There is no height or weight on file to calculate BMI.   Physical Exam   No results found for any visits on 03/19/19.     Assessment & Plan    1. Tinnitus of both ears  Counseled treatment will depend on underlying cause. Will refer to ENT for hearing assessment. Discussed white noise or other background  noise to distract from tinnitus.   - Ambulatory referral to ENT  2. Elevated TSH  - TSH  I discussed the assessment and treatment plan with the patient. The patient was provided an opportunity to ask questions and all were answered. The patient agreed with the plan and demonstrated an understanding of the instructions.   The patient was advised to call back or seek an in-person evaluation if the symptoms worsen or if the condition fails to improve as anticipated.  I provided 15 minutes of non-face-to-face time during this encounter.    Trinna Post, PA-C  [facility]

## 2019-03-21 LAB — TSH: TSH: 2.21 u[IU]/mL (ref 0.450–4.500)

## 2019-07-20 ENCOUNTER — Other Ambulatory Visit: Payer: Self-pay | Admitting: Family Medicine

## 2019-07-20 MED ORDER — SCOPOLAMINE 1 MG/3DAYS TD PT72: 1.0000 | MEDICATED_PATCH | TRANSDERMAL | 12 refills | Status: DC

## 2019-07-20 NOTE — Telephone Encounter (Signed)
Please advise? Medication was discontinued from patient's medication list.  

## 2019-07-20 NOTE — Telephone Encounter (Signed)
Medication: scopolamine (TRANSDERM-SCOP) 1 MG/3DAYS [id number]  DISCONTINUED Last OV 03/19/19 attempted to call the office to see if OV was needed no answer. Patient will be going on a boat this weekend.  Has the patient contacted their pharmacy? Yes  (Agent: If no, request that the patient contact the pharmacy for the refill.) (Agent: If yes, when and what did the pharmacy advise?)  Preferred Pharmacy (with phone number or street name): [id number] Zachary Hess [address]  Phone:  [phone] Fax:  [phone]     Agent: Please be advised that RX refills may take up to 3 business days. We ask that you follow-up with your pharmacy.

## 2019-08-13 NOTE — Progress Notes (Signed)
Complete physical exam  I,[name],acting as a scribe for [name], MD.,have documented all relevant documentation on the behalf of [name], MD,as directed by  [name], MD while in the presence of [name], MD.   Patient: Zachary Hess   DOB: 1960-03-29   59 y.o. Male  MRN: [id number] Visit Date: 08/17/2019  Today's healthcare provider: [name], MD   Chief Complaint  Patient presents with  . Annual Exam   Subjective    Zachary Hess is a 59 y.o. male who presents today for a complete physical exam.  He reports consuming a general diet. Home exercise routine includes Tennis. He generally feels fairly well. He reports sleeping fairly well. He does not have additional problems to discuss today.  HPI  He does complain of some nasal congestion and allergies.  He also states that with Covid he is having some anxiety and some concentration issues.  A little bit of depressed mood with this.  Past Medical History:  Diagnosis Date  . Anxiety   . HBP (high blood pressure)   . Sleep apnea 01/03/2017   Past Surgical History:  Procedure Laterality Date  . ANAL FISSURE REPAIR    . COLONOSCOPY WITH PROPOFOL N/A 10/07/2018   Procedure: COLONOSCOPY WITH PROPOFOL;  Surgeon: [name], MD;  Location[facility] ENDOSCOPY;  Service: Gastroenterology;  Laterality: N/A;  . FOOT SURGERY Left   . MANDIBLE FRACTURE SURGERY    . MOUTH SURGERY     WISDOM TEETH   Social History   Socioeconomic History  . Marital status: Married    Spouse name: Not on file  . Number of children: Not on file  . Years of education: Not on file  . Highest education level: Not on file  Occupational History  . Not on file  Tobacco Use  . Smoking status: Never Smoker  . Smokeless tobacco: Never Used  Vaping Use  . Vaping Use: Never used  Substance and Sexual Activity  . Alcohol use: Yes    Alcohol/week: 2.0 standard drinks    Types: 2 Standard drinks or  equivalent per week    Comment: SOCIALLY  . Drug use: No  . Sexual activity: Not on file  Other Topics Concern  . Not on file  Social History Narrative  . Not on file   Social Determinants of Health   Financial Resource Strain:   . Difficulty of Paying Living Expenses:   Food Insecurity:   . Worried About Charity fundraiser in the Last Year:   . Arboriculturist in the Last Year:   Transportation Needs:   . Film/video editor (Medical):   Marland Kitchen Lack of Transportation (Non-Medical):   Physical Activity:   . Days of Exercise per Week:   . Minutes of Exercise per Session:   Stress:   . Feeling of Stress :   Social Connections:   . Frequency of Communication with Friends and Family:   . Frequency of Social Gatherings with Friends and Family:   . Attends Religious Services:   . Active Member of Clubs or Organizations:   . Attends Archivist Meetings:   Marland Kitchen Marital Status:   Intimate Partner Violence:   . Fear of Current or Ex-Partner:   . Emotionally Abused:   Marland Kitchen Physically Abused:   . Sexually Abused:    Family Status  Relation Name Status  . Father  Deceased at age 88  .  Mother  Alive  . Sister  Alive  . Brother  Alive  . Annamarie Major  Deceased  . MGM  Deceased at age 26  . Brother  Alive  . PGF  (Not Specified)   Family History  Problem Relation Age of Onset  . Congestive Heart Failure Father   . CAD Father   . Heart attack Father   . Diabetes Mother   . Atrial fibrillation Mother   . Heart attack Paternal Uncle   . Heart disease Maternal Grandmother   . Stroke Paternal Grandfather    No Known Allergies  Patient Care Team: Jerrol Banana., MD as PCP - General (Family Medicine)   Medications: Outpatient Medications Prior to Visit  Medication Sig  . Ascorbic Acid (VITAMIN C) 1000 MG tablet Take 1,000 mg by mouth daily.  Marland Kitchen aspirin 81 MG tablet Take by mouth.  Marland Kitchen atorvastatin (LIPITOR) 40 MG tablet Take 1 tablet (40 mg total) by mouth at  bedtime.  . Cholecalciferol 1000 UNITS tablet Take by mouth.  . Coenzyme Q10 (COQ10) 100 MG CAPS Take by mouth.  . levothyroxine (SYNTHROID) 25 MCG tablet Take 1 tablet (25 mcg total) by mouth daily before breakfast.  . Loratadine (CLARITIN PO) Take by mouth daily.  Marland Kitchen losartan (COZAAR) 50 MG tablet TAKE 1 TABLET DAILY  . scopolamine (TRANSDERM-SCOP) 1 MG/3DAYS Place 1 patch (1.5 mg total) onto the skin every 3 (three) days.  . Triamcinolone Acetonide (NASACORT AQ NA) Place into the nose.  . triamcinolone cream (KENALOG) 0.1 %   . [DISCONTINUED] venlafaxine XR (EFFEXOR-XR) 37.5 MG 24 hr capsule TAKE 1 CAPSULE DAILY  . celecoxib (CELEBREX) 200 MG capsule Take 1 capsule (200 mg total) by mouth daily as needed for mild pain. (Patient not taking: Reported on 10/07/2018)  . fluticasone (FLONASE) 50 MCG/ACT nasal spray Place into the nose. (Patient not taking: Reported on 08/17/2019)  . losartan (COZAAR) 50 MG tablet TAKE 1 TABLET DAILY (Patient not taking: Reported on 08/17/2019)  . losartan (COZAAR) 50 MG tablet TAKE 1 TABLET DAILY (Patient not taking: Reported on 08/17/2019)   No facility-administered medications prior to visit.    Review of Systems  HENT: Positive for congestion and sinus pressure.   Psychiatric/Behavioral: Positive for decreased concentration. The patient is nervous/anxious.   All other systems reviewed and are negative.      Objective    BP 124/77 (BP Location: Left Arm, Patient Position: Sitting, Cuff Size: Large)   Pulse 63   Temp (!) 97.3 F (36.3 C) (Other (Comment))   Resp 16   Ht 6\' 3"  (1.905 m)   Wt 222 lb (100.7 kg)   SpO2 97%   BMI 27.75 kg/m     Physical Exam Vitals and nursing note reviewed.  Constitutional:      Appearance: Normal appearance. He is normal weight.  HENT:     Right Ear: Tympanic membrane normal.     Left Ear: Tympanic membrane normal.     Nose: Nose normal.     Mouth/Throat:     Mouth: Mucous membranes are moist.  Eyes:      General: No scleral icterus.    Conjunctiva/sclera: Conjunctivae normal.  Cardiovascular:     Rate and Rhythm: Normal rate and regular rhythm.     Pulses: Normal pulses.     Heart sounds: Normal heart sounds.  Pulmonary:     Effort: Pulmonary effort is normal.     Breath sounds: Normal breath sounds.  Abdominal:  General: Bowel sounds are normal.     Palpations: Abdomen is soft.  Genitourinary:    Penis: Normal.      Testes: Normal.  Musculoskeletal:        General: Normal range of motion.     Cervical back: Normal range of motion and neck supple.  Skin:    General: Skin is warm.  Neurological:     General: No focal deficit present.     Mental Status: He is alert and oriented to person, place, and time.  Psychiatric:        Mood and Affect: Mood normal.        Behavior: Behavior normal.        Thought Content: Thought content normal.        Judgment: Judgment normal.       Depression Screen  PHQ 2/9 Scores 08/17/2019 08/14/2018 08/14/2018  PHQ - 2 Score 0 0 0  PHQ- 9 Score 4 0 -    No results found for any visits on 08/17/19.  Assessment & Plan    Routine Health Maintenance and Physical Exam  Exercise Activities and Dietary recommendations Goals   None     Immunization History  Administered Date(s) Administered  . Hepatitis A 01/24/2011, 03/13/2011, 07/24/2011  . Hepatitis B 01/24/2011, 03/13/2011, 07/24/2011  . Influenza Split 02/25/2012  . Influenza,inj,Quad PF,6+ Mos 01/21/2013, 11/25/2013, 11/29/2014, 12/13/2015, 01/03/2017, 01/09/2018  . Td 10/04/2003  . Tdap 11/25/2013  . Typhoid Inactivated 03/11/2011  . Zoster Recombinat (Shingrix) 08/17/2019    Health Maintenance  Topic Date Due  . HIV Screening  Never done  . INFLUENZA VACCINE  09/27/2019  . COLONOSCOPY  10/06/2021  . TETANUS/TDAP  11/26/2023  . Hepatitis C Screening  Completed    Discussed health benefits of physical activity, and encouraged him to engage in regular exercise appropriate  for his age and condition.  1. Annual physical exam Patient had colonoscopy on 10/07/2018.  This revealed tubular adenoma x2. - POCT urinalysis dipstick - Lipid panel - TSH - CBC w/Diff/Platelet - Comprehensive Metabolic Panel (CMET) - PSA  2. Essential (primary) hypertension  - Lipid panel - TSH - CBC w/Diff/Platelet - Comprehensive Metabolic Panel (CMET)  3. Other hyperlipidemia  - Lipid panel - TSH - CBC w/Diff/Platelet - Comprehensive Metabolic Panel (CMET)  4. Elevated TSH  - Lipid panel - TSH - CBC w/Diff/Platelet - Comprehensive Metabolic Panel (CMET)  5. Screening for prostate cancer  - PSA  6. Screening for blood or protein in urine  - POCT urinalysis dipstick  7. Need for shingles vaccine  - Varicella-zoster vaccine IM (Shingrix)  8. Mild depression (HCC) Increased Effexor from 37.5 to 75 mg daily.  - venlafaxine XR (EFFEXOR-XR) 75 MG 24 hr capsule; Take 1 capsule (75 mg total) by mouth daily with breakfast.  Dispense: 90 capsule; Refill: 3 Return to clinic 2 to 3 months on this. - Lipid panel - TSH - CBC w/Diff/Platelet - Comprehensive Metabolic Panel (CMET)   Return in 9 weeks (on 10/19/2019).     I, [name], MD, have reviewed all documentation for this visit. The documentation on 08/20/19 for the exam, diagnosis, procedures, and orders are all accurate and complete.    Leon Montoya Cranford Mon, MD  [facility] [phone] (phone) [phone] (fax)  [place]

## 2019-08-17 ENCOUNTER — Other Ambulatory Visit: Payer: Self-pay

## 2019-08-17 ENCOUNTER — Ambulatory Visit (INDEPENDENT_AMBULATORY_CARE_PROVIDER_SITE_OTHER): Payer: No Typology Code available for payment source | Admitting: Family Medicine

## 2019-08-17 ENCOUNTER — Encounter: Payer: Self-pay | Admitting: Family Medicine

## 2019-08-17 VITALS — BP 124/77 | HR 63 | Temp 97.3°F | Resp 16 | Ht 75.0 in | Wt 222.0 lb

## 2019-08-17 DIAGNOSIS — E7849 Other hyperlipidemia: Secondary | ICD-10-CM

## 2019-08-17 DIAGNOSIS — Z0000 Encounter for general adult medical examination without abnormal findings: Secondary | ICD-10-CM | POA: Diagnosis not present

## 2019-08-17 DIAGNOSIS — F32A Depression, unspecified: Secondary | ICD-10-CM

## 2019-08-17 DIAGNOSIS — Z1389 Encounter for screening for other disorder: Secondary | ICD-10-CM

## 2019-08-17 DIAGNOSIS — R7989 Other specified abnormal findings of blood chemistry: Secondary | ICD-10-CM

## 2019-08-17 DIAGNOSIS — I10 Essential (primary) hypertension: Secondary | ICD-10-CM

## 2019-08-17 DIAGNOSIS — Z125 Encounter for screening for malignant neoplasm of prostate: Secondary | ICD-10-CM

## 2019-08-17 DIAGNOSIS — F320 Major depressive disorder, single episode, mild: Secondary | ICD-10-CM

## 2019-08-17 DIAGNOSIS — Z23 Encounter for immunization: Secondary | ICD-10-CM

## 2019-08-17 LAB — POCT URINALYSIS DIPSTICK
Bilirubin, UA: NEGATIVE
Blood, UA: NEGATIVE
Glucose, UA: NEGATIVE
Ketones, UA: NEGATIVE
Leukocytes, UA: NEGATIVE
Nitrite, UA: NEGATIVE
Odor: NORMAL
Protein, UA: NEGATIVE
Spec Grav, UA: 1.02 (ref 1.010–1.025)
Urobilinogen, UA: 0.2 E.U./dL
pH, UA: 5 (ref 5.0–8.0)

## 2019-08-17 MED ORDER — VENLAFAXINE HCL ER 75 MG PO CP24: 75.0000 mg | ORAL_CAPSULE | Freq: Every day | ORAL | 3 refills | Status: DC

## 2019-08-19 ENCOUNTER — Telehealth: Payer: Self-pay

## 2019-08-19 LAB — COMPREHENSIVE METABOLIC PANEL
ALT: 32 IU/L (ref 0–44)
AST: 26 IU/L (ref 0–40)
Albumin/Globulin Ratio: 1.9 (ref 1.2–2.2)
Albumin: 4.5 g/dL (ref 3.8–4.8)
Alkaline Phosphatase: 87 IU/L (ref 48–121)
BUN/Creatinine Ratio: 11 (ref 10–24)
BUN: 11 mg/dL (ref 8–27)
Bilirubin Total: 0.9 mg/dL (ref 0.0–1.2)
CO2: 25 mmol/L (ref 20–29)
Calcium: 9.6 mg/dL (ref 8.6–10.2)
Chloride: 101 mmol/L (ref 96–106)
Creatinine, Ser: 0.99 mg/dL (ref 0.76–1.27)
GFR calc Af Amer: 95 mL/min/{1.73_m2} (ref >59)
GFR calc non Af Amer: 82 mL/min/{1.73_m2} (ref >59)
Globulin, Total: 2.4 g/dL (ref 1.5–4.5)
Glucose: 97 mg/dL (ref 65–99)
Potassium: 4.4 mmol/L (ref 3.5–5.2)
Sodium: 138 mmol/L (ref 134–144)
Total Protein: 6.9 g/dL (ref 6.0–8.5)

## 2019-08-19 LAB — CBC WITH DIFFERENTIAL/PLATELET
Basophils Absolute: 0 10*3/uL (ref 0.0–0.2)
Basos: 1 %
EOS (ABSOLUTE): 0.2 10*3/uL (ref 0.0–0.4)
Eos: 2 %
Hematocrit: 38.1 % (ref 37.5–51.0)
Hemoglobin: 12.6 g/dL — ABNORMAL LOW (ref 13.0–17.7)
Immature Grans (Abs): 0 10*3/uL (ref 0.0–0.1)
Immature Granulocytes: 0 %
Lymphocytes Absolute: 1.3 10*3/uL (ref 0.7–3.1)
Lymphs: 17 %
MCH: 29.5 pg (ref 26.6–33.0)
MCHC: 33.1 g/dL (ref 31.5–35.7)
MCV: 89 fL (ref 79–97)
Monocytes Absolute: 0.6 10*3/uL (ref 0.1–0.9)
Monocytes: 8 %
Neutrophils Absolute: 5.7 10*3/uL (ref 1.4–7.0)
Neutrophils: 72 %
Platelets: 208 10*3/uL (ref 150–450)
RBC: 4.27 x10E6/uL (ref 4.14–5.80)
RDW: 12.2 % (ref 11.6–15.4)
WBC: 7.9 10*3/uL (ref 3.4–10.8)

## 2019-08-19 LAB — LIPID PANEL
Chol/HDL Ratio: 2.5 ratio (ref 0.0–5.0)
Cholesterol, Total: 131 mg/dL (ref 100–199)
HDL: 53 mg/dL (ref >39)
LDL Chol Calc (NIH): 61 mg/dL (ref 0–99)
Triglycerides: 91 mg/dL (ref 0–149)
VLDL Cholesterol Cal: 17 mg/dL (ref 5–40)

## 2019-08-19 LAB — PSA: Prostate Specific Ag, Serum: 1.5 ng/mL (ref 0.0–4.0)

## 2019-08-19 LAB — TSH: TSH: 2.03 u[IU]/mL (ref 0.450–4.500)

## 2019-08-19 NOTE — Telephone Encounter (Signed)
-----   Message from Jerrol Banana., MD sent at 08/19/2019  3:47 PM EDT ----- Labs good

## 2019-08-19 NOTE — Telephone Encounter (Signed)
Patient given lab result through mychart and he has read the provider's comments.

## 2019-10-15 NOTE — Progress Notes (Signed)
I,April Hess,acting as a scribe for Zachary Durie, MD.,have documented all relevant documentation on the behalf of Zachary Durie, MD,as directed by  Zachary Durie, MD while in the presence of Zachary Durie, MD.   Established patient visit   Patient: Zachary Hess   DOB: February 13, 1961   59 y.o. Male  MRN: [id number] Visit Date: 10/19/2019  Today's healthcare provider: [name], MD   Chief Complaint  Patient presents with  . Depression  . Follow-up  . Hyperlipidemia  . Hypertension   Subjective    HPI  On the last visit we went up on Effexor and this is helped him to be less irritable and feels more like himself lately.  Hypertension, follow-up  BP Readings from Last 3 Encounters:  10/19/19 127/77  08/17/19 124/77  10/07/18 100/65   Wt Readings from Last 3 Encounters:  10/19/19 218 lb (98.9 kg)  08/17/19 222 lb (100.7 kg)  10/07/18 209 lb (94.8 kg)     He was last seen for hypertension 2 months ago.  BP at that visit was 124/77. Management since that visit includes; on losartan. He reports good compliance with treatment. He is not having side effects. none He is exercising. He is adherent to low salt diet.   Outside blood pressures are normal.  He does not smoke.  Use of agents associated with hypertension: none.   --------------------------------------------------------------------  Lipid/Cholesterol, follow-up  Last Lipid Panel: Lab Results  Component Value Date   CHOL 131 08/18/2019   LDLCALC 61 08/18/2019   HDL 53 08/18/2019   TRIG 91 08/18/2019    He was last seen for this 2 months ago.  Management since that visit includes; labs checked showing-good. On atorvastatin.  He reports good compliance with treatment. He is not having side effects. none  He is following a Low fat diet. Current exercise: yard work  Last metabolic panel Lab Results  Component Value Date   GLUCOSE 97 08/18/2019   NA 138 08/18/2019   K  4.4 08/18/2019   BUN 11 08/18/2019   CREATININE 0.99 08/18/2019   GFRNONAA 82 08/18/2019   GFRAA 95 08/18/2019   CALCIUM 9.6 08/18/2019   AST 26 08/18/2019   ALT 32 08/18/2019   The 10-year ASCVD risk score [name], et al., 2013) is: 7.2%  --------------------------------------------------------------------  Depression, Follow-up  He  was last seen for this 2 months ago. Changes made at last visit include; Increased Effexor from 37.5 to 75 mg daily.    He reports good compliance with treatment. He is not having side effects. none  He reports good tolerance of treatment. Current symptoms include: n/a He feels he is Improved since last visit.  Depression screen [facility] 2/9 08/17/2019 08/14/2018 08/14/2018  Decreased Interest 0 0 0  Down, Depressed, Hopeless 0 0 0  PHQ - 2 Score 0 0 0  Altered sleeping 1 0 -  Tired, decreased energy 1 0 -  Change in appetite 1 0 -  Feeling bad or failure about yourself  0 0 -  Trouble concentrating 1 0 -  Moving slowly or fidgety/restless 0 0 -  Suicidal thoughts 0 0 -  PHQ-9 Score 4 0 -  Difficult doing work/chores Not difficult at all Not difficult at all -    --------------------------------------------------------------------  Past Medical History:  Diagnosis Date  . Anxiety   . HBP (high blood pressure)   . Sleep apnea 01/03/2017       Medications:  Outpatient Medications Prior to Visit  Medication Sig  . Ascorbic Acid (VITAMIN C) 1000 MG tablet Take 1,000 mg by mouth daily.  Marland Kitchen aspirin 81 MG tablet Take by mouth.  Marland Kitchen atorvastatin (LIPITOR) 40 MG tablet TAKE 1 TABLET AT BEDTIME  . Cholecalciferol 1000 UNITS tablet Take by mouth.  . Coenzyme Q10 (COQ10) 100 MG CAPS Take by mouth.  . fluticasone (FLONASE) 50 MCG/ACT nasal spray Place into the nose.   . levothyroxine (SYNTHROID) 25 MCG tablet Take 1 tablet (25 mcg total) by mouth daily before breakfast.  . Loratadine (CLARITIN PO) Take by mouth daily.  Marland Kitchen losartan (COZAAR) 50 MG  tablet TAKE 1 TABLET DAILY  . scopolamine (TRANSDERM-SCOP) 1 MG/3DAYS Place 1 patch (1.5 mg total) onto the skin every 3 (three) days.  . Triamcinolone Acetonide (NASACORT AQ NA) Place into the nose.  . triamcinolone cream (KENALOG) 0.1 %   . venlafaxine XR (EFFEXOR-XR) 75 MG 24 hr capsule Take 1 capsule (75 mg total) by mouth daily with breakfast.  . celecoxib (CELEBREX) 200 MG capsule Take 1 capsule (200 mg total) by mouth daily as needed for mild pain. (Patient not taking: Reported on 10/07/2018)  . losartan (COZAAR) 50 MG tablet TAKE 1 TABLET DAILY (Patient not taking: Reported on 08/17/2019)  . losartan (COZAAR) 50 MG tablet TAKE 1 TABLET DAILY (Patient not taking: Reported on 08/17/2019)   No facility-administered medications prior to visit.    Review of Systems  Constitutional: Negative for appetite change, chills and fever.  Respiratory: Negative for chest tightness, shortness of breath and wheezing.   Cardiovascular: Negative for chest pain and palpitations.  Gastrointestinal: Negative for abdominal pain, nausea and vomiting.    Last vitamin B12 and Folate No results found for: VITAMINB12, FOLATE    Objective    BP 127/77 (BP Location: Left Arm, Patient Position: Sitting, Cuff Size: Large)   Pulse 60   Temp 98.3 F (36.8 C) (Oral)   Resp 16   Ht 6\' 3"  (1.905 m)   Wt 218 lb (98.9 kg)   SpO2 97%   BMI 27.25 kg/m  BP Readings from Last 3 Encounters:  10/19/19 127/77  08/17/19 124/77  10/07/18 100/65   Wt Readings from Last 3 Encounters:  10/19/19 218 lb (98.9 kg)  08/17/19 222 lb (100.7 kg)  10/07/18 209 lb (94.8 kg)      Physical Exam  BP 127/77 (BP Location: Left Arm, Patient Position: Sitting, Cuff Size: Large)   Pulse 60   Temp 98.3 F (36.8 C) (Oral)   Resp 16   Ht 6\' 3"  (1.905 m)   Wt 218 lb (98.9 kg)   SpO2 97%   BMI 27.25 kg/m   General Appearance:    Alert, cooperative, no distress, appears stated age  Head:    Normocephalic, without obvious  abnormality, atraumatic  Eyes:    PERRL, conjunctiva/corneas clear, EOM's intact, fundi    benign, both eyes       Ears:    Normal TM's and external ear canals, both ears  Nose:   Nares normal, septum midline, mucosa normal, no drainage   or sinus tenderness  Throat:   Lips, mucosa, and tongue normal; teeth and gums normal  Neck:   Supple, symmetrical, trachea midline, no adenopathy;       thyroid:  No enlargement/tenderness/nodules; no carotid   bruit or JVD  Back:     Symmetric, no curvature, ROM normal, no CVA tenderness  Lungs:     Clear to  auscultation bilaterally, respirations unlabored  Chest wall:    No tenderness or deformity  Heart:    Regular rate and rhythm, S1 and S2 normal, no murmur, rub   or gallop  Abdomen:     Soft, non-tender, bowel sounds active all four quadrants,    no masses, no organomegaly  Genitalia:    Normal male without lesion, discharge or tenderness  Rectal:    Normal tone, normal prostate, no masses or tenderness;   guaiac negative stool  Extremities:   Extremities normal, atraumatic, no cyanosis or edema  Pulses:   2+ and symmetric all extremities  Skin:   Skin color, texture, turgor normal, no rashes or lesions  Lymph nodes:   Cervical, supraclavicular, and axillary nodes normal  Neurologic:   CNII-XII intact. Normal strength, sensation and reflexes      throughout     No results found for any visits on 10/19/19.  Assessment & Plan     1. Essential (primary) hypertension Good control.  2. Other hyperlipidemia   3. Mild depression (HCC) Improved on higher dose of Effexor.  Follow-up 6 months  4. Need for shingles vaccine  - Varicella-zoster vaccine IM (Shingrix)   Return in about 6 months (around 04/20/2020).         Talton Delpriore Cranford Mon, MD  [facility] [phone] (phone) [phone] (fax)  [place]

## 2019-10-18 ENCOUNTER — Other Ambulatory Visit: Payer: Self-pay | Admitting: Family Medicine

## 2019-10-18 DIAGNOSIS — E7849 Other hyperlipidemia: Secondary | ICD-10-CM

## 2019-10-19 ENCOUNTER — Ambulatory Visit (INDEPENDENT_AMBULATORY_CARE_PROVIDER_SITE_OTHER): Payer: No Typology Code available for payment source | Admitting: Family Medicine

## 2019-10-19 ENCOUNTER — Other Ambulatory Visit: Payer: Self-pay

## 2019-10-19 ENCOUNTER — Encounter: Payer: Self-pay | Admitting: Family Medicine

## 2019-10-19 VITALS — BP 127/77 | HR 60 | Temp 98.3°F | Resp 16 | Ht 75.0 in | Wt 218.0 lb

## 2019-10-19 DIAGNOSIS — F320 Major depressive disorder, single episode, mild: Secondary | ICD-10-CM

## 2019-10-19 DIAGNOSIS — E7849 Other hyperlipidemia: Secondary | ICD-10-CM | POA: Diagnosis not present

## 2019-10-19 DIAGNOSIS — F32A Depression, unspecified: Secondary | ICD-10-CM

## 2019-10-19 DIAGNOSIS — Z23 Encounter for immunization: Secondary | ICD-10-CM

## 2019-10-19 DIAGNOSIS — I10 Essential (primary) hypertension: Secondary | ICD-10-CM | POA: Diagnosis not present

## 2019-11-24 ENCOUNTER — Other Ambulatory Visit: Payer: Self-pay | Admitting: Family Medicine

## 2019-11-24 DIAGNOSIS — F32A Depression, unspecified: Secondary | ICD-10-CM

## 2019-11-24 DIAGNOSIS — F320 Major depressive disorder, single episode, mild: Secondary | ICD-10-CM

## 2019-11-24 MED ORDER — VENLAFAXINE HCL ER 75 MG PO CP24: 75.0000 mg | ORAL_CAPSULE | Freq: Every day | ORAL | 3 refills | Status: DC

## 2019-11-24 NOTE — Telephone Encounter (Signed)
[facility] [place] faxed refill request for the following medications:  venlafaxine XR CAP 37.5 MG   Please advise.  Thanks, [employer]

## 2019-12-28 ENCOUNTER — Other Ambulatory Visit: Payer: Self-pay | Admitting: Family Medicine

## 2019-12-28 NOTE — Telephone Encounter (Signed)
[facility] [place] [facility] [facility] refill request for the following medications:  losartan (COZAAR) 50 MG tablet   Please advise.

## 2019-12-29 MED ORDER — LOSARTAN POTASSIUM 50 MG PO TABS
50.0000 mg | ORAL_TABLET | Freq: Every day | ORAL | 3 refills | Status: DC
Start: 2019-12-29 — End: 2020-09-13

## 2020-02-17 ENCOUNTER — Other Ambulatory Visit: Payer: Self-pay | Admitting: Family Medicine

## 2020-02-17 DIAGNOSIS — E7849 Other hyperlipidemia: Secondary | ICD-10-CM

## 2020-02-17 NOTE — Telephone Encounter (Signed)
Requested medication (s) are due for refill today: No  Requested medication (s) are on the active medication list: Yes  Last refill:  10/18/19  Future visit scheduled: Yes  Notes to clinic:  Pharmacy needs diagnosis code. Emergency supply while waiting for mail order.    Requested Prescriptions  Pending Prescriptions Disp Refills   atorvastatin (LIPITOR) 40 MG tablet [Pharmacy Med Name: ATORVASTATIN 40 MG TABLET] 10 tablet     Sig: TAKE 1 TABLET BY MOUTH EVERYDAY AT BEDTIME      Cardiovascular:  Antilipid - Statins Failed - 02/17/2020 11:24 AM      Failed - LDL in normal range and within 360 days    LDL Chol Calc (NIH)  Date Value Ref Range Status  08/18/2019 61 0 - 99 mg/dL Final          Passed - Total Cholesterol in normal range and within 360 days    Cholesterol, Total  Date Value Ref Range Status  08/18/2019 131 100 - 199 mg/dL Final          Passed - HDL in normal range and within 360 days    HDL  Date Value Ref Range Status  08/18/2019 53 >39 mg/dL Final          Passed - Triglycerides in normal range and within 360 days    Triglycerides  Date Value Ref Range Status  08/18/2019 91 0 - 149 mg/dL Final   Triglyceride fasting, serum  Date Value Ref Range Status  04/26/2009 107 mg/dL           Passed - Patient is not pregnant      Passed - Valid encounter within last 12 months    Recent Outpatient Visits           4 months ago Essential (primary) hypertension   [place] Jerrol Banana., MD   6 months ago Annual physical exam   [facility] Jerrol Banana., MD   11 months ago [facility] [place], [place], [place]   1 year ago Annual physical exam   [facility] Jerrol Banana., MD   2 years ago Essential (primary) hypertension   [facility] Jerrol Banana., MD       Future Appointments             In 2 months Jerrol Banana., MD [facility] [place], [place]

## 2020-04-20 ENCOUNTER — Other Ambulatory Visit: Payer: Self-pay

## 2020-04-20 ENCOUNTER — Ambulatory Visit
Admission: RE | Admit: 2020-04-20 | Discharge: 2020-04-20 | Disposition: A | Discharge status: Home / Self Care | Payer: No Typology Code available for payment source | Attending: Family Medicine | Admitting: Family Medicine

## 2020-04-20 ENCOUNTER — Ambulatory Visit
Admission: RE | Admit: 2020-04-20 | Discharge: 2020-04-20 | Disposition: A | Discharge status: Home / Self Care | Payer: No Typology Code available for payment source | Source: Ambulatory Visit | Attending: Family Medicine | Admitting: Family Medicine

## 2020-04-20 ENCOUNTER — Ambulatory Visit (INDEPENDENT_AMBULATORY_CARE_PROVIDER_SITE_OTHER): Payer: No Typology Code available for payment source | Admitting: Family Medicine

## 2020-04-20 ENCOUNTER — Encounter: Payer: Self-pay | Admitting: Family Medicine

## 2020-04-20 VITALS — BP 135/83 | HR 66 | Temp 97.9°F | Resp 16 | Ht 75.0 in | Wt 214.0 lb

## 2020-04-20 DIAGNOSIS — M79645 Pain in left finger(s): Secondary | ICD-10-CM

## 2020-04-20 DIAGNOSIS — F320 Major depressive disorder, single episode, mild: Secondary | ICD-10-CM

## 2020-04-20 DIAGNOSIS — I10 Essential (primary) hypertension: Secondary | ICD-10-CM | POA: Diagnosis not present

## 2020-04-20 DIAGNOSIS — M542 Cervicalgia: Secondary | ICD-10-CM | POA: Diagnosis present

## 2020-04-20 DIAGNOSIS — M25512 Pain in left shoulder: Secondary | ICD-10-CM

## 2020-04-20 DIAGNOSIS — E7849 Other hyperlipidemia: Secondary | ICD-10-CM

## 2020-04-20 DIAGNOSIS — F32A Depression, unspecified: Secondary | ICD-10-CM

## 2020-04-20 NOTE — Patient Instructions (Signed)
Try T-Gel shampoo.

## 2020-04-20 NOTE — Progress Notes (Signed)
Established patient visit   Patient: Zachary Hess   DOB: 04-Nov-1960   60 y.o. Male  MRN: [id number] Visit Date: 04/20/2020  Today's healthcare provider: [name], MD   Chief Complaint  Patient presents with  . Hypertension   Subjective    HPI  Patient comes in today for follow-up.  He feels well, his wife is under increased job stress but otherwise doing well.  He has been working on diet and exercise and has lost 8 pounds since his last visit. He does have a concerning complaint of left shoulder pain and left thumb pain.  No known trauma.  Hurts to move the shoulder in the thumb.  Sometimes it hurts all the way down the arm.  He takes 2 arthritis strength Tylenol in the morning this helps with the pain.  This really does not limit him. Hypertension, follow-up  BP Readings from Last 3 Encounters:  04/20/20 135/83  10/19/19 127/77  08/17/19 124/77   Wt Readings from Last 3 Encounters:  04/20/20 214 lb (97.1 kg)  10/19/19 218 lb (98.9 kg)  08/17/19 222 lb (100.7 kg)     He was last seen for hypertension 6 months ago.  BP at that visit was 127/77. Management since that visit includes; good control. He reports good compliance with treatment. He is not having side effects.  He is exercising. He is adherent to low salt diet.   Outside blood pressures are checked occasionally.  He does not smoke.  Use of agents associated with hypertension: none.   Depression, Follow-up  He  was last seen for this 6 months ago. Changes made at last visit include; Improved on higher dose of Effexor.  Follow-up 6 months.   He reports good compliance with treatment. He is not having side effects.   He reports good tolerance of treatment. He feels he is Unchanged since last visit.  Depression screen [facility] 2/9 04/20/2020 08/17/2019 08/14/2018  Decreased Interest 0 0 0  Down, Depressed, Hopeless 0 0 0  PHQ - 2 Score 0 0 0  Altered sleeping 0 1 0  Tired, decreased energy 1 1 0   Change in appetite 0 1 0  Feeling bad or failure about yourself  0 0 0  Trouble concentrating 0 1 0  Moving slowly or fidgety/restless 0 0 0  Suicidal thoughts 0 0 0  PHQ-9 Score 1 4 0  Difficult doing work/chores Not difficult at all Not difficult at all Not difficult at all        Medications: Outpatient Medications Prior to Visit  Medication Sig  . Ascorbic Acid (VITAMIN C) 1000 MG tablet Take 1,000 mg by mouth daily.  Marland Kitchen aspirin 81 MG tablet Take by mouth.  Marland Kitchen atorvastatin (LIPITOR) 40 MG tablet TAKE 1 TABLET BY MOUTH EVERYDAY AT BEDTIME  . Cholecalciferol 1000 UNITS tablet Take by mouth.  . Coenzyme Q10 (COQ10) 100 MG CAPS Take by mouth.  . fluticasone (FLONASE) 50 MCG/ACT nasal spray Place into the nose.   . levothyroxine (SYNTHROID) 25 MCG tablet Take 1 tablet (25 mcg total) by mouth daily before breakfast.  . Loratadine (CLARITIN PO) Take by mouth daily.  . celecoxib (CELEBREX) 200 MG capsule Take 1 capsule (200 mg total) by mouth daily as needed for mild pain. (Patient not taking: No sig reported)  . losartan (COZAAR) 50 MG tablet TAKE 1 TABLET DAILY (Patient not taking: Reported on 08/17/2019)  . losartan (COZAAR) 50 MG tablet TAKE 1  TABLET DAILY (Patient not taking: Reported on 08/17/2019)  . losartan (COZAAR) 50 MG tablet Take 1 tablet (50 mg total) by mouth daily.  Marland Kitchen scopolamine (TRANSDERM-SCOP) 1 MG/3DAYS Place 1 patch (1.5 mg total) onto the skin every 3 (three) days.  . Triamcinolone Acetonide (NASACORT AQ NA) Place into the nose.  . triamcinolone cream (KENALOG) 0.1 %   . venlafaxine XR (EFFEXOR-XR) 75 MG 24 hr capsule Take 1 capsule (75 mg total) by mouth daily with breakfast.   No facility-administered medications prior to visit.    Review of Systems  Constitutional: Negative for appetite change, chills and fever.  Respiratory: Negative for chest tightness, shortness of breath and wheezing.   Cardiovascular: Negative for chest pain and palpitations.   Gastrointestinal: Negative for abdominal pain, nausea and vomiting.        Objective    BP 135/83   Pulse 66   Temp 97.9 F (36.6 C)   Resp 16   Ht 6\' 3"  (1.905 m)   Wt 214 lb (97.1 kg)   BMI 26.75 kg/m  BP Readings from Last 3 Encounters:  04/20/20 135/83  10/19/19 127/77  08/17/19 124/77   Wt Readings from Last 3 Encounters:  04/20/20 214 lb (97.1 kg)  10/19/19 218 lb (98.9 kg)  08/17/19 222 lb (100.7 kg)       Physical Exam Vitals and nursing note reviewed.  Constitutional:      Appearance: Normal appearance. He is normal weight.  HENT:     Right Ear: Tympanic membrane normal.     Left Ear: Tympanic membrane normal.     Nose: Nose normal.     Mouth/Throat:     Mouth: Mucous membranes are moist.  Eyes:     General: No scleral icterus.    Conjunctiva/sclera: Conjunctivae normal.  Cardiovascular:     Rate and Rhythm: Normal rate and regular rhythm.     Pulses: Normal pulses.     Heart sounds: Normal heart sounds.  Pulmonary:     Effort: Pulmonary effort is normal.     Breath sounds: Normal breath sounds.  Abdominal:     General: Bowel sounds are normal.     Palpations: Abdomen is soft.  Musculoskeletal:     Cervical back: Normal range of motion and neck supple.     Comments: He has mild discomfort with abduction of the left shoulder.  He has minimal tenderness at the base of the left thumb.  Skin:    General: Skin is warm.  Neurological:     General: No focal deficit present.     Mental Status: He is alert and oriented to person, place, and time.     Comments: Strength in both upper extremities is normal.  Psychiatric:        Mood and Affect: Mood normal.        Behavior: Behavior normal.        Thought Content: Thought content normal.        Judgment: Judgment normal.       No results found for any visits on 04/20/20.  Assessment & Plan     1. Essential (primary) hypertension Excellent control on losartan  2. Other hyperlipidemia On  atorvastatin Physical later in the year.  3. Mild depression ([place]) Doing well with venlafaxine low-dose  4. Pain of left thumb I think he has got arthritis of the left thumb - DG Finger Thumb Left; Future  5. Acute pain of left shoulder He is got a rotator  cuff arthropathy - DG Shoulder Left; Future  6. Neck pain This could be cervical disc disease but unlikely. Start with x-rays and then proceed with evaluation of this progresses.  At this time Tylenol 2 tablets in the morning is covering him so we will continue this. - DG Cervical Spine Complete; Future   No follow-ups on file.      I, [name], MD, have reviewed all documentation for this visit. The documentation on 04/20/20 for the exam, diagnosis, procedures, and orders are all accurate and complete.    Jaylianna Tatlock Cranford Mon, MD  [facility] [phone] (phone) [phone] (fax)  [place]

## 2020-05-17 ENCOUNTER — Other Ambulatory Visit: Payer: Self-pay | Admitting: *Deleted

## 2020-05-17 ENCOUNTER — Other Ambulatory Visit: Payer: Self-pay | Admitting: Family Medicine

## 2020-05-17 ENCOUNTER — Telehealth: Payer: Self-pay | Admitting: *Deleted

## 2020-05-17 DIAGNOSIS — E7849 Other hyperlipidemia: Secondary | ICD-10-CM

## 2020-05-17 MED ORDER — ATORVASTATIN CALCIUM 40 MG PO TABS: ORAL_TABLET | ORAL | 0 refills | Status: DC

## 2020-05-17 NOTE — Telephone Encounter (Signed)
Patient would like Rx for Atorvastatin sent to mail order pharmacy original Rx was print- so patient will need this corrected- unable to correct through [place].

## 2020-05-17 NOTE — Telephone Encounter (Signed)
Call to patient- patient request 90 days supply sent to mail order company. Advised patient to let office know if he needs anything else

## 2020-05-17 NOTE — Progress Notes (Signed)
Patient original Rx was printed- and unable to change to normal to send to mail order- sent to office for Rx correction and refill.

## 2020-06-24 ENCOUNTER — Ambulatory Visit (INDEPENDENT_AMBULATORY_CARE_PROVIDER_SITE_OTHER): Payer: No Typology Code available for payment source | Admitting: Podiatry

## 2020-06-24 ENCOUNTER — Encounter: Payer: Self-pay | Admitting: Podiatry

## 2020-06-24 ENCOUNTER — Ambulatory Visit (INDEPENDENT_AMBULATORY_CARE_PROVIDER_SITE_OTHER): Payer: No Typology Code available for payment source

## 2020-06-24 ENCOUNTER — Other Ambulatory Visit: Payer: Self-pay

## 2020-06-24 DIAGNOSIS — M205X1 Other deformities of toe(s) (acquired), right foot: Secondary | ICD-10-CM | POA: Diagnosis not present

## 2020-06-24 DIAGNOSIS — M7751 Other enthesopathy of right foot: Secondary | ICD-10-CM

## 2020-06-24 MED ORDER — METHYLPREDNISOLONE 4 MG PO TBPK: ORAL_TABLET | ORAL | 0 refills | Status: DC

## 2020-07-04 ENCOUNTER — Telehealth: Payer: Self-pay

## 2020-07-04 NOTE — Telephone Encounter (Signed)
Patient was advised.  

## 2020-07-04 NOTE — Telephone Encounter (Signed)
Copied from [place] [id number]. Topic: General - Other >> Jul 04, 2020 11:59 AM Antonieta Iba C wrote: Reason for CRM: pt called in for assistance. Pt says that he was Dx with Covid. Pt says that he was suggested a Z-Pak. Pt says that he has some congestion, cough and  a scratchy throat at times. Pt says that he doesn't have a fever.    Pt would like to be advised further from provider.   Please advise

## 2020-07-04 NOTE — Telephone Encounter (Signed)
Please advise 

## 2020-07-04 NOTE — Telephone Encounter (Signed)
Pt called for an update, anxiously awaiting feedback

## 2020-07-04 NOTE — Telephone Encounter (Signed)
No evidence of Z-Pak makes a difference at all with COVID.  Recommend Robitussin twice a day vitamin C daily vitamin D daily and zinc daily until symptoms better.  Let us know if he develops any other symptoms.  Or if symptoms worse.

## 2020-07-12 DIAGNOSIS — M7751 Other enthesopathy of right foot: Secondary | ICD-10-CM | POA: Diagnosis not present

## 2020-07-12 MED ORDER — BETAMETHASONE SOD PHOS & ACET 6 (3-3) MG/ML IJ SUSP
3.0000 mg | Freq: Once | INTRAMUSCULAR | Status: AC
  Administered 2020-07-12: 3 mg via INTRA_ARTICULAR

## 2020-07-12 NOTE — Progress Notes (Signed)
   HPI: 60 y.o. male presenting today as a reestablish new patient for evaluation of pain and tenderness to the right great toe joint.  Patient has a history of first MTPJ arthroplasty with implant left great toe in 2014 which is doing very well.  He has slowly developed arthritis to his right great toe joint.  He experiences pain and burning with stiffness.  He would like to discuss different treatment options and possibly surgery.  He has been resting his foot and applying ice and taking Tylenol arthritis.  Past Medical History:  Diagnosis Date  . Anxiety   . HBP (high blood pressure)   . Sleep apnea 01/03/2017     Physical Exam: General: The patient is alert and oriented x3 in no acute distress.  Dermatology: Skin is warm, dry and supple bilateral lower extremities. Negative for open lesions or macerations.  Vascular: Palpable pedal pulses bilaterally. No edema or erythema noted. Capillary refill within normal limits.  Neurological: Epicritic and protective threshold grossly intact bilaterally.   Musculoskeletal Exam: Limited range of motion with pain on palpation and range of motion to the first MTPJ of the right foot  Radiographic Exam:  Normal osseous mineralization.  Joint space narrowing with degenerative changes and periarticular spurring noted to the right first MTPJ  Assessment: 1.  Hallux limitus right 2. H/o first MTPJ arthroplasty with implant left.  Dr. Paulla Dolly. 2014   Plan of Care:  1. Patient evaluated. X-Rays reviewed.  2.  Injection of 0.5 cc Celestone Soluspan injection of the first MTPJ right foot 3.  Prescription for Medrol Dosepak 4.  Today we discussed different treatment options.  The patient is very busy this summer and would like to return to clinic in 2 months for possible surgical consultation since he did so well with the left foot  *Going to the [place] next week      Edrick Kins, DPM [facility]  Dr. Edrick Kins, DPM     [address],  [address]                Office [phone]  Fax [phone]

## 2020-08-05 ENCOUNTER — Ambulatory Visit (INDEPENDENT_AMBULATORY_CARE_PROVIDER_SITE_OTHER): Payer: No Typology Code available for payment source | Admitting: Podiatry

## 2020-08-05 ENCOUNTER — Encounter: Payer: Self-pay | Admitting: Podiatry

## 2020-08-05 ENCOUNTER — Other Ambulatory Visit: Payer: Self-pay

## 2020-08-05 DIAGNOSIS — M205X1 Other deformities of toe(s) (acquired), right foot: Secondary | ICD-10-CM

## 2020-08-05 DIAGNOSIS — M7751 Other enthesopathy of right foot: Secondary | ICD-10-CM

## 2020-08-05 NOTE — Progress Notes (Signed)
   HPI: 60 y.o. male presenting today for follow-up evaluation of hallux limitus to the right great toe joint.  Patient has a history of first MTPJ arthroplasty with implant left great toe in 2014 which is doing very well.  He has slowly developed arthritis to his right great toe joint.   Patient continues to take Tylenol arthritis.  He states that the injection he received last visit helped significantly.  He is doing well but he is ready to have the joint replacement procedure performed to the right foot  Past Medical History:  Diagnosis Date   Anxiety    HBP (high blood pressure)    Sleep apnea 01/03/2017     Physical Exam: General: The patient is alert and oriented x3 in no acute distress.  Dermatology: Skin is warm, dry and supple bilateral lower extremities. Negative for open lesions or macerations.  Vascular: Palpable pedal pulses bilaterally. No edema or erythema noted. Capillary refill within normal limits.  Neurological: Epicritic and protective threshold grossly intact bilaterally.   Musculoskeletal Exam: Limited range of motion with pain on palpation and range of motion to the first MTPJ of the right foot  Radiographic Exam taken last visit:  Normal osseous mineralization.  Joint space narrowing with degenerative changes and periarticular spurring noted to the right first MTPJ  Assessment: 1.  Hallux limitus right 2. H/o first MTPJ arthroplasty with implant left.  Dr. Paulla Dolly. 2014   Plan of Care:  1. Patient evaluated. 2. Today we discussed the conservative versus surgical management of the presenting pathology. The patient opts for surgical management. All possible complications and details of the procedure were explained. All patient questions were answered. No guarantees were expressed or implied. 3. Authorization for surgery was initiated today. Surgery will consist of right great toe joint arthroplasty with implant 4.  Return to clinic 1 week postop   *Went to the  [facility], May 2022      Edrick Kins, DPM [facility]  Dr. Edrick Kins, DPM    [address], [place] [address]                Office [phone]  Fax [phone]

## 2020-08-08 ENCOUNTER — Other Ambulatory Visit: Payer: Self-pay | Admitting: Family Medicine

## 2020-08-08 DIAGNOSIS — E7849 Other hyperlipidemia: Secondary | ICD-10-CM

## 2020-08-18 ENCOUNTER — Encounter: Payer: Self-pay | Admitting: Family Medicine

## 2020-09-13 ENCOUNTER — Ambulatory Visit (INDEPENDENT_AMBULATORY_CARE_PROVIDER_SITE_OTHER): Payer: No Typology Code available for payment source | Admitting: Family Medicine

## 2020-09-13 ENCOUNTER — Other Ambulatory Visit: Payer: Self-pay

## 2020-09-13 ENCOUNTER — Encounter: Payer: Self-pay | Admitting: Family Medicine

## 2020-09-13 VITALS — BP 145/90 | HR 63 | Resp 16 | Ht 75.0 in | Wt 222.0 lb

## 2020-09-13 DIAGNOSIS — R7989 Other specified abnormal findings of blood chemistry: Secondary | ICD-10-CM | POA: Diagnosis not present

## 2020-09-13 DIAGNOSIS — Z125 Encounter for screening for malignant neoplasm of prostate: Secondary | ICD-10-CM

## 2020-09-13 DIAGNOSIS — Z0000 Encounter for general adult medical examination without abnormal findings: Secondary | ICD-10-CM | POA: Diagnosis not present

## 2020-09-13 DIAGNOSIS — I10 Essential (primary) hypertension: Secondary | ICD-10-CM | POA: Diagnosis not present

## 2020-09-13 DIAGNOSIS — E7849 Other hyperlipidemia: Secondary | ICD-10-CM

## 2020-09-13 MED ORDER — LOSARTAN POTASSIUM 100 MG PO TABS: 100.0000 mg | ORAL_TABLET | Freq: Every day | ORAL | 3 refills | Status: DC

## 2020-09-13 NOTE — Progress Notes (Signed)
I,Zachary Hess,acting as a scribe for Zachary Durie, MD.,have documented all relevant documentation on the behalf of Zachary Durie, MD,as directed by  Zachary Durie, MD while in the presence of Zachary Durie, MD.   Complete physical exam   Patient: Zachary Hess   DOB: [birth year]   60 y.o. Male  MRN: [id number] Visit Date: 09/13/2020  Today's healthcare provider: [name], MD   Chief Complaint  Patient presents with   Annual Exam   Subjective    Zachary Hess is a 60 y.o. male who presents today for a complete physical exam.  He reports consuming a general diet. Home exercise routine includes walking, yard work, golf. He generally feels well. He reports sleeping fairly well. He does not have additional problems to discuss today.  Patient is married and does attempt to get regular exercise.  Blood pressure at home runs 130s over 70s most of the time.  He has had 3 COVID vaccines but then developed COVID and May 2022. HPI  He is having some discomfort and weakness in his left thumb/hand.  Past Medical History:  Diagnosis Date   Anxiety    HBP (high blood pressure)    Sleep apnea 01/03/2017   Past Surgical History:  Procedure Laterality Date   ANAL FISSURE REPAIR     COLONOSCOPY WITH PROPOFOL N/A 10/07/2018   Procedure: COLONOSCOPY WITH PROPOFOL;  Surgeon: [name], MD;  Location: [facility]COPY;  Service: Gastroenterology;  Laterality: N/A;   FOOT SURGERY Left    MANDIBLE FRACTURE SURGERY     MOUTH SURGERY     WISDOM TEETH   Social History   Socioeconomic History   Marital status: Married    Spouse name: Not on file   Number of children: Not on file   Years of education: Not on file   Highest education level: Not on file  Occupational History   Not on file  Tobacco Use   Smoking status: Never   Smokeless tobacco: Never  Vaping Use   Vaping Use: Never used  Substance and Sexual Activity   Alcohol use: Yes    Alcohol/week: 2.0  standard drinks    Types: 2 Standard drinks or equivalent per week    Comment: SOCIALLY   Drug use: No   Sexual activity: Not on file  Other Topics Concern   Not on file  Social History Narrative   Not on file   Social Determinants of Health   Financial Resource Strain: Not on file  Food Insecurity: Not on file  Transportation Needs: Not on file  Physical Activity: Not on file  Stress: Not on file  Social Connections: Not on file  Intimate Partner Violence: Not on file   Family Status  Relation Name Status   Father  Deceased at age 19   Mother  Alive   Sister  Alive   Brother  Alive   [place]  Deceased   MGM  Deceased at age 57   Brother  Alive   PGF  (Not Specified)   Family History  Problem Relation Age of Onset   Congestive Heart Failure Father    CAD Father    Heart attack Father    Diabetes Mother    Atrial fibrillation Mother    Heart attack Paternal Uncle    Heart disease Maternal Grandmother    Stroke Paternal Grandfather    No Known Allergies  Patient Care Team: Jerrol Banana., MD  as PCP - General (Family Medicine)   Medications: Outpatient Medications Prior to Visit  Medication Sig   Ascorbic Acid (VITAMIN C) 1000 MG tablet Take 1,000 mg by mouth daily.   aspirin 81 MG tablet Take by mouth.   atorvastatin (LIPITOR) 40 MG tablet TAKE 1 TABLET AT BEDTIME   Cholecalciferol 1000 UNITS tablet Take by mouth.   Coenzyme Q10 (COQ10) 100 MG CAPS Take by mouth.   fluticasone (FLONASE) 50 MCG/ACT nasal spray Place into the nose.    Loratadine (CLARITIN PO) Take by mouth daily.   losartan (COZAAR) 50 MG tablet Take 1 tablet (50 mg total) by mouth daily.   LYSINE PO Take by mouth.   Multiple Vitamins-Minerals (ZINC PO) Take by mouth.   triamcinolone cream (KENALOG) 0.1 %    venlafaxine XR (EFFEXOR-XR) 75 MG 24 hr capsule Take 1 capsule (75 mg total) by mouth daily with breakfast.   Triamcinolone Acetonide (NASACORT AQ NA) Place into the nose.  (Patient not taking: Reported on 09/13/2020)   [DISCONTINUED] methylPREDNISolone (MEDROL DOSEPAK) 4 MG TBPK tablet 6 day dose pack - take as directed   [DISCONTINUED] scopolamine (TRANSDERM-SCOP) 1 MG/3DAYS Place 1 patch (1.5 mg total) onto the skin every 3 (three) days.   No facility-administered medications prior to visit.    Review of Systems  Musculoskeletal:  Positive for arthralgias.  Allergic/Immunologic: Positive for environmental allergies.  All other systems reviewed and are negative.     Objective    BP (!) 145/90 (BP Location: Left Arm, Patient Position: Sitting, Cuff Size: Large)   Pulse 63   Resp 16   Ht 6\' 3"  (1.905 m)   Wt 222 lb (100.7 kg)   SpO2 97%   BMI 27.75 kg/m     Physical Exam Vitals and nursing note reviewed.  Constitutional:      Appearance: Normal appearance. He is normal weight.  HENT:     Right Ear: Tympanic membrane normal.     Left Ear: Tympanic membrane normal.     Nose: Nose normal.     Mouth/Throat:     Mouth: Mucous membranes are moist.  Eyes:     General: No scleral icterus.    Conjunctiva/sclera: Conjunctivae normal.  Cardiovascular:     Rate and Rhythm: Normal rate and regular rhythm.     Pulses: Normal pulses.     Heart sounds: Normal heart sounds.  Pulmonary:     Effort: Pulmonary effort is normal.     Breath sounds: Normal breath sounds.  Abdominal:     General: Bowel sounds are normal.     Palpations: Abdomen is soft.  Genitourinary:    Penis: Normal.      Testes: Normal.  Musculoskeletal:     Cervical back: Normal range of motion and neck supple.  Skin:    General: Skin is warm and dry.     Comments: Fair skin.  Neurological:     General: No focal deficit present.     Mental Status: He is alert and oriented to person, place, and time.  Psychiatric:        Mood and Affect: Mood normal.        Behavior: Behavior normal.        Thought Content: Thought content normal.        Judgment: Judgment normal.      Last  depression screening scores PHQ 2/9 Scores 09/13/2020 04/20/2020 08/17/2019  PHQ - 2 Score 0 0 0  PHQ- 9 Score 0 1 4  Last fall risk screening Fall Risk  09/13/2020  Falls in the past year? 0  Number falls in past yr: 0  Injury with Fall? 0  Risk for fall due to : No Fall Risks  Follow up Falls evaluation completed   Last Audit-C alcohol use screening Alcohol Use Disorder Test (AUDIT) 09/13/2020  1. How often do you have a drink containing alcohol? 3  2. How many drinks containing alcohol do you have on a typical day when you are drinking? 1  3. How often do you have six or more drinks on one occasion? 0  AUDIT-C Score 4  4. How often during the last year have you found that you were not able to stop drinking once you had started? 0  5. How often during the last year have you failed to do what was normally expected from you because of drinking? 0  6. How often during the last year have you needed a first drink in the morning to get yourself going after a heavy drinking session? 0  7. How often during the last year have you had a feeling of guilt of remorse after drinking? 0  8. How often during the last year have you been unable to remember what happened the night before because you had been drinking? 0  9. Have you or someone else been injured as a result of your drinking? 0  10. Has a relative or friend or a doctor or another health worker been concerned about your drinking or suggested you cut down? 0  Alcohol Use Disorder Identification Test Final Score (AUDIT) 4  Alcohol Brief Interventions/Follow-up -   A score of 3 or more in women, and 4 or more in men indicates increased risk for alcohol abuse, EXCEPT if all of the points are from question 1   No results found for any visits on 09/13/20.  Assessment & Plan    Routine Health Maintenance and Physical Exam  Exercise Activities and Dietary recommendations  Goals   None     Immunization History  Administered Date(s)  Administered   Hepatitis A 01/24/2011, 03/13/2011, 07/24/2011   Hepatitis B 01/24/2011, 03/13/2011, 07/24/2011   Influenza Split 02/25/2012   Influenza,inj,Quad PF,6+ Mos 01/21/2013, 11/25/2013, 11/29/2014, 12/13/2015, 01/03/2017, 01/09/2018   PFIZER(Purple Top)SARS-COV-2 Vaccination 12/22/2019   Td 10/04/2003   Tdap 11/25/2013   Typhoid Inactivated 03/11/2011   Zoster Recombinat (Shingrix) 08/17/2019, 10/19/2019    Health Maintenance  Topic Date Due   HIV Screening  Never done   COVID-19 Vaccine (2 - Pfizer series) 01/12/2020   INFLUENZA VACCINE  09/26/2020   COLONOSCOPY (Pts 45-27yrs Insurance coverage will need to be confirmed)  10/06/2021   TETANUS/TDAP  11/26/2023   Hepatitis C Screening  Completed   Zoster Vaccines- Shingrix  Completed   Pneumococcal Vaccine 74-39 Years old  Aged Out   HPV VACCINES  Aged Out    Discussed health benefits of physical activity, and encouraged him to engage in regular exercise appropriate for his age and condition.  1. Annual physical exam   2. Essential (primary) hypertension Increase losartan from 50 to 100 mg daily.  Continue to check home blood pressures. - CBC w/Diff/Platelet - Comprehensive Metabolic Panel (CMET) - losartan (COZAAR) 100 MG tablet; Take 1 tablet (100 mg total) by mouth daily.  Dispense: 90 tablet; Refill: 3  3. Other hyperlipidemia On atorvastatin 40 - Lipid panel  4. Elevated TSH Follow TS - TSH  5. Screening for prostate cancer  - PSA  No follow-ups on file.     I, Zachary Durie, MD, have reviewed all documentation for this visit. The documentation on 09/18/20 for the exam, diagnosis, procedures, and orders are all accurate and complete.    Sabrine Patchen Cranford Mon, MD  [facility] [phone] (phone) [phone] (fax)  [place]

## 2020-09-16 LAB — TSH: TSH: 1.53 u[IU]/mL (ref 0.450–4.500)

## 2020-09-16 LAB — LIPID PANEL
Chol/HDL Ratio: 2.9 ratio (ref 0.0–5.0)
Cholesterol, Total: 154 mg/dL (ref 100–199)
HDL: 54 mg/dL (ref >39)
LDL Chol Calc (NIH): 82 mg/dL (ref 0–99)
Triglycerides: 95 mg/dL (ref 0–149)
VLDL Cholesterol Cal: 18 mg/dL (ref 5–40)

## 2020-09-16 LAB — COMPREHENSIVE METABOLIC PANEL
ALT: 34 IU/L (ref 0–44)
AST: 30 IU/L (ref 0–40)
Albumin/Globulin Ratio: 2.1 (ref 1.2–2.2)
Albumin: 4.8 g/dL (ref 3.8–4.8)
Alkaline Phosphatase: 87 IU/L (ref 44–121)
BUN/Creatinine Ratio: 15 (ref 10–24)
BUN: 16 mg/dL (ref 8–27)
Bilirubin Total: 0.8 mg/dL (ref 0.0–1.2)
CO2: 26 mmol/L (ref 20–29)
Calcium: 10 mg/dL (ref 8.6–10.2)
Chloride: 99 mmol/L (ref 96–106)
Creatinine, Ser: 1.06 mg/dL (ref 0.76–1.27)
Globulin, Total: 2.3 g/dL (ref 1.5–4.5)
Glucose: 102 mg/dL — ABNORMAL HIGH (ref 65–99)
Potassium: 5.2 mmol/L (ref 3.5–5.2)
Sodium: 137 mmol/L (ref 134–144)
Total Protein: 7.1 g/dL (ref 6.0–8.5)
eGFR: 79 mL/min/{1.73_m2} (ref >59)

## 2020-09-16 LAB — CBC WITH DIFFERENTIAL/PLATELET
Basophils Absolute: 0.1 10*3/uL (ref 0.0–0.2)
Basos: 1 %
EOS (ABSOLUTE): 0.2 10*3/uL (ref 0.0–0.4)
Eos: 3 %
Hematocrit: 41.5 % (ref 37.5–51.0)
Hemoglobin: 13.8 g/dL (ref 13.0–17.7)
Immature Grans (Abs): 0 10*3/uL (ref 0.0–0.1)
Immature Granulocytes: 0 %
Lymphocytes Absolute: 2 10*3/uL (ref 0.7–3.1)
Lymphs: 30 %
MCH: 30.1 pg (ref 26.6–33.0)
MCHC: 33.3 g/dL (ref 31.5–35.7)
MCV: 90 fL (ref 79–97)
Monocytes Absolute: 0.7 10*3/uL (ref 0.1–0.9)
Monocytes: 10 %
Neutrophils Absolute: 3.9 10*3/uL (ref 1.4–7.0)
Neutrophils: 56 %
Platelets: 237 10*3/uL (ref 150–450)
RBC: 4.59 x10E6/uL (ref 4.14–5.80)
RDW: 11.9 % (ref 11.6–15.4)
WBC: 6.8 10*3/uL (ref 3.4–10.8)

## 2020-09-16 LAB — PSA: Prostate Specific Ag, Serum: 2 ng/mL (ref 0.0–4.0)

## 2020-10-27 ENCOUNTER — Other Ambulatory Visit: Payer: Self-pay | Admitting: Family Medicine

## 2020-10-27 DIAGNOSIS — E7849 Other hyperlipidemia: Secondary | ICD-10-CM

## 2020-10-27 NOTE — Telephone Encounter (Signed)
Future OV 03/14/21 Approved per protocol.  Requested Prescriptions  Pending Prescriptions Disp Refills  . atorvastatin (LIPITOR) 40 MG tablet [Pharmacy Med Name: ATORVASTATIN TAB '40MG'$ ] 90 tablet 0    Sig: TAKE 1 TABLET AT BEDTIME     Cardiovascular:  Antilipid - Statins Passed - 10/27/2020  8:14 AM      Passed - Total Cholesterol in normal range and within 360 days    Cholesterol, Total  Date Value Ref Range Status  09/15/2020 154 100 - 199 mg/dL Final         Passed - LDL in normal range and within 360 days    LDL Chol Calc (NIH)  Date Value Ref Range Status  09/15/2020 82 0 - 99 mg/dL Final         Passed - HDL in normal range and within 360 days    HDL  Date Value Ref Range Status  09/15/2020 54 >39 mg/dL Final         Passed - Triglycerides in normal range and within 360 days    Triglycerides  Date Value Ref Range Status  09/15/2020 95 0 - 149 mg/dL Final   Triglyceride fasting, serum  Date Value Ref Range Status  04/26/2009 107 mg/dL          Passed - Patient is not pregnant      Passed - Valid encounter within last 12 months    Recent Outpatient Visits          1 month ago Annual physical exam   [facility] Jerrol Banana., MD   6 months ago Essential (primary) hypertension   [facility] Jerrol Banana., MD   1 year ago Essential (primary) hypertension   [facility] Jerrol Banana., MD   1 year ago Annual physical exam   [facility] Jerrol Banana., MD   1 year ago [facility] [place] [facility], [place]      Future Appointments            In 4 months Jerrol Banana., MD [facility], [place]

## 2020-11-29 ENCOUNTER — Other Ambulatory Visit: Payer: Self-pay | Admitting: Family Medicine

## 2020-11-29 DIAGNOSIS — F32A Depression, unspecified: Secondary | ICD-10-CM

## 2021-01-16 ENCOUNTER — Other Ambulatory Visit: Payer: Self-pay | Admitting: Family Medicine

## 2021-01-16 DIAGNOSIS — I10 Essential (primary) hypertension: Secondary | ICD-10-CM

## 2021-01-16 MED ORDER — LOSARTAN POTASSIUM 100 MG PO TABS: 100.0000 mg | ORAL_TABLET | Freq: Every day | ORAL | 0 refills | Status: DC

## 2021-01-16 NOTE — Telephone Encounter (Signed)
Medication Refill - Medication: losartan (COZAAR) 100 MG tablet   Pt is waiting for mail order and it will not be here until the 26th or beyond. Pt is completely out and is requesting a short supply to last him.   Has the patient contacted their pharmacy? Yes.   (Agent: If no, request that the patient contact the pharmacy for the refill. If patient does not wish to contact the pharmacy document the reason why and proceed with request.) (Agent: If yes, when and what did the pharmacy advise?)  Preferred Pharmacy (with phone number or street name):  [id number] [place] [address]  [address] [place] [address]  Phone: [phone] Fax: [phone]   Has the patient been seen for an appointment in the last year OR does the patient have an upcoming appointment? Yes.    Agent: Please be advised that RX refills may take up to 3 business days. We ask that you follow-up with your pharmacy.

## 2021-01-16 NOTE — Telephone Encounter (Signed)
Enough given to last until mail order arrives.  Requested Prescriptions  Pending Prescriptions Disp Refills  . losartan (COZAAR) 100 MG tablet 10 tablet 0    Sig: Take 1 tablet (100 mg total) by mouth daily.     Cardiovascular:  Angiotensin Receptor Blockers Failed - 01/16/2021  1:18 PM      Failed - Last BP in normal range    BP Readings from Last 1 Encounters:  09/13/20 (!) 145/90         Passed - Cr in normal range and within 180 days    Creatinine, Ser  Date Value Ref Range Status  09/15/2020 1.06 0.76 - 1.27 mg/dL Final         Passed - K in normal range and within 180 days    Potassium  Date Value Ref Range Status  09/15/2020 5.2 3.5 - 5.2 mmol/L Final         Passed - Patient is not pregnant      Passed - Valid encounter within last 6 months    Recent Outpatient Visits          4 months ago Annual physical exam   [facility] Jerrol Banana., MD   9 months ago Essential (primary) hypertension   [facility] Jerrol Banana., MD   1 year ago Essential (primary) hypertension   [facility] Jerrol Banana., MD   1 year ago Annual physical exam   [facility] Jerrol Banana., MD   1 year ago [facility] [place], [place]      Future Appointments            In 1 month Jerrol Banana., MD [facility], [place]

## 2021-01-25 ENCOUNTER — Other Ambulatory Visit: Payer: Self-pay | Admitting: Family Medicine

## 2021-01-25 DIAGNOSIS — E7849 Other hyperlipidemia: Secondary | ICD-10-CM

## 2021-02-03 ENCOUNTER — Telehealth: Payer: Self-pay | Admitting: Urology

## 2021-02-03 NOTE — Telephone Encounter (Signed)
DOS - 02/23/21  KELLER BUNION IMPLANT RIGHT --- [id number]   AETNA EFFECTIVE DATE - 03/19/19   PER AETNA'S AUTOMATIVE SYSTEM FOR CPT CODE 48307 NO PRIOR AUTH IS REQUIRED.  REF # [id number]

## 2021-02-10 ENCOUNTER — Encounter: Payer: No Typology Code available for payment source | Admitting: Podiatry

## 2021-02-17 ENCOUNTER — Encounter: Payer: No Typology Code available for payment source | Admitting: Podiatry

## 2021-02-23 ENCOUNTER — Encounter: Payer: Self-pay | Admitting: Podiatry

## 2021-02-23 ENCOUNTER — Other Ambulatory Visit: Payer: Self-pay | Admitting: Podiatry

## 2021-02-23 DIAGNOSIS — M205X1 Other deformities of toe(s) (acquired), right foot: Secondary | ICD-10-CM | POA: Diagnosis not present

## 2021-02-23 MED ORDER — OXYCODONE-ACETAMINOPHEN 5-325 MG PO TABS: 1.0000 | ORAL_TABLET | ORAL | 0 refills | Status: DC | PRN

## 2021-02-23 MED ORDER — DICLOFENAC SODIUM 75 MG PO TBEC: 75.0000 mg | DELAYED_RELEASE_TABLET | Freq: Two times a day (BID) | ORAL | 1 refills | Status: DC

## 2021-02-23 NOTE — Progress Notes (Signed)
PRN postop 

## 2021-03-03 ENCOUNTER — Encounter: Payer: No Typology Code available for payment source | Admitting: Podiatry

## 2021-03-03 ENCOUNTER — Ambulatory Visit (INDEPENDENT_AMBULATORY_CARE_PROVIDER_SITE_OTHER): Payer: BC Managed Care – PPO

## 2021-03-03 ENCOUNTER — Other Ambulatory Visit: Payer: Self-pay

## 2021-03-03 ENCOUNTER — Ambulatory Visit (INDEPENDENT_AMBULATORY_CARE_PROVIDER_SITE_OTHER): Payer: BC Managed Care – PPO | Admitting: Podiatry

## 2021-03-03 ENCOUNTER — Encounter: Payer: Self-pay | Admitting: Podiatry

## 2021-03-03 VITALS — BP 132/83 | HR 67 | Temp 97.3°F

## 2021-03-03 DIAGNOSIS — M205X1 Other deformities of toe(s) (acquired), right foot: Secondary | ICD-10-CM

## 2021-03-03 DIAGNOSIS — Z9889 Other specified postprocedural states: Secondary | ICD-10-CM

## 2021-03-03 NOTE — Progress Notes (Signed)
° °  Subjective:  Patient presents today status post right first MTP arthroplasty with implant. DOS: 02/23/2021.  Patient states that he is feeling well.  He has been resting his foot and the dressings have been clean and dry since surgery.  Pain is very tolerable.  He presents for further treatment and evaluation  Past Medical History:  Diagnosis Date   Anxiety    HBP (high blood pressure)    Sleep apnea 01/03/2017      Objective/Physical Exam Neurovascular status intact.  Skin incisions appear to be well coapted with staples intact. No sign of infectious process noted. No dehiscence. No active bleeding noted. Moderate edema noted to the surgical extremity.  Radiographic Exam:  Silicone implant and osteotomies sites appear to be stable with routine healing.  Assessment: 1. s/p first MTP arthroplasty with implant right. DOS: 02/23/2021   Plan of Care:  1. Patient was evaluated. X-rays reviewed 2.  Patient may begin washing and showering and getting the foot wet 3.  Betadine ointment provided to apply daily with a large Band-Aid. 4.  Continue Ace wrap daily.  Ace wrap is provided 5.  Continue weightbearing in the postsurgical shoe 6.  Return to clinic in 1 week   Edrick Kins, DPM [facility]  Dr. Edrick Kins, DPM    [address], [place] [address]                Office [phone]  Fax [phone]

## 2021-03-10 ENCOUNTER — Other Ambulatory Visit: Payer: Self-pay

## 2021-03-10 ENCOUNTER — Ambulatory Visit (INDEPENDENT_AMBULATORY_CARE_PROVIDER_SITE_OTHER): Payer: BC Managed Care – PPO | Admitting: Podiatry

## 2021-03-10 ENCOUNTER — Encounter: Payer: Self-pay | Admitting: Podiatry

## 2021-03-10 DIAGNOSIS — Z9889 Other specified postprocedural states: Secondary | ICD-10-CM

## 2021-03-10 NOTE — Progress Notes (Signed)
° °  Subjective:  Patient presents today status post right first MTP arthroplasty with implant. DOS: 02/23/2021.  Patient continues to do very well.  He has been walking as tolerated in the surgical shoe.  No new complaints at this time  Past Medical History:  Diagnosis Date   Anxiety    HBP (high blood pressure)    Sleep apnea 01/03/2017     Past Surgical History:  Procedure Laterality Date   ANAL FISSURE REPAIR     COLONOSCOPY WITH PROPOFOL N/A 10/07/2018   Procedure: COLONOSCOPY WITH PROPOFOL;  Surgeon: [name], MD;  Location: [facility] ENDOSCOPY;  Service: Gastroenterology;  Laterality: N/A;   FOOT SURGERY Left    MANDIBLE FRACTURE SURGERY     MOUTH SURGERY     WISDOM TEETH   No Known Allergies   Objective/Physical Exam Neurovascular status intact.  Skin incisions appear to be well coapted with staples intact. No sign of infectious process noted. No dehiscence. No active bleeding noted.  Negative for any significant edema noted to the surgical foot.  Range of motion to the first MTP is good with no real pain on palpation  Assessment: 1. s/p first MTP arthroplasty with implant right. DOS: 02/23/2021   Plan of Care:  1. Patient was evaluated.  2.  Staples were removed today. 3.  Continue weightbearing in the surgical shoe for 1 additional week.  After that he can transition into good supportive sneakers 4.  Return to clinic in 2 weeks for follow-up x-ray   Edrick Kins, DPM [facility]  Dr. Edrick Kins, DPM    [address], [place] [address]                Office [phone]  Fax [phone]

## 2021-03-14 ENCOUNTER — Ambulatory Visit: Payer: Self-pay | Admitting: Family Medicine

## 2021-03-24 ENCOUNTER — Encounter: Payer: No Typology Code available for payment source | Admitting: Podiatry

## 2021-03-28 ENCOUNTER — Ambulatory Visit (INDEPENDENT_AMBULATORY_CARE_PROVIDER_SITE_OTHER): Payer: BC Managed Care – PPO | Admitting: Podiatry

## 2021-03-28 ENCOUNTER — Ambulatory Visit (INDEPENDENT_AMBULATORY_CARE_PROVIDER_SITE_OTHER): Payer: BC Managed Care – PPO

## 2021-03-28 ENCOUNTER — Other Ambulatory Visit: Payer: Self-pay

## 2021-03-28 ENCOUNTER — Encounter: Payer: Self-pay | Admitting: Podiatry

## 2021-03-28 DIAGNOSIS — M205X1 Other deformities of toe(s) (acquired), right foot: Secondary | ICD-10-CM

## 2021-03-28 DIAGNOSIS — Z9889 Other specified postprocedural states: Secondary | ICD-10-CM

## 2021-03-28 NOTE — Progress Notes (Signed)
° °  Subjective:  Patient presents today status post right first MTP arthroplasty with implant. DOS: 02/23/2021.  Patient continues to do very well.  He has been walking in the sneakers and tennis shoes as instructed.  No new complaints at this time  Past Medical History:  Diagnosis Date   Anxiety    HBP (high blood pressure)    Sleep apnea 01/03/2017     Past Surgical History:  Procedure Laterality Date   ANAL FISSURE REPAIR     COLONOSCOPY WITH PROPOFOL N/A 10/07/2018   Procedure: COLONOSCOPY WITH PROPOFOL;  Surgeon: [name], MD;  Location: [facility] ENDOSCOPY;  Service: Gastroenterology;  Laterality: N/A;   FOOT SURGERY Left    MANDIBLE FRACTURE SURGERY     MOUTH SURGERY     WISDOM TEETH   No Known Allergies   Objective/Physical Exam Neurovascular status intact.  Skin incisions healed.  No sign of infectious process noted. No dehiscence. No active bleeding noted.  Negative for any significant edema noted to the surgical foot.  Range of motion to the first MTP is good with no real pain on palpation  Assessment: 1. s/p first MTP arthroplasty with implant right. DOS: 02/23/2021   Plan of Care:  1. Patient was evaluated.  2.  Continue gentle range of motion exercises to the great toe joint to break up scar tissue increase range of motion 3.  Patient may now slowly increase to full activity no restrictions of the next few weeks 4.  Return to clinic as needed   Edrick Kins, DPM [facility]  Dr. Edrick Kins, DPM    [address], [place] [address]                Office [phone]  Fax [phone]

## 2021-04-19 ENCOUNTER — Telehealth: Payer: Self-pay | Admitting: Family Medicine

## 2021-04-19 ENCOUNTER — Other Ambulatory Visit: Payer: Self-pay

## 2021-04-19 ENCOUNTER — Other Ambulatory Visit: Payer: Self-pay | Admitting: Podiatry

## 2021-04-19 DIAGNOSIS — I10 Essential (primary) hypertension: Secondary | ICD-10-CM

## 2021-04-19 MED ORDER — LOSARTAN POTASSIUM 100 MG PO TABS: 100.0000 mg | ORAL_TABLET | Freq: Every day | ORAL | 0 refills | Status: DC

## 2021-04-19 NOTE — Telephone Encounter (Signed)
[facility] for the following medications:  losartan (COZAAR) 100 MG tablet   Please advise.

## 2021-05-03 ENCOUNTER — Encounter: Payer: Self-pay | Admitting: Family Medicine

## 2021-05-03 ENCOUNTER — Other Ambulatory Visit: Payer: Self-pay

## 2021-05-03 ENCOUNTER — Ambulatory Visit (INDEPENDENT_AMBULATORY_CARE_PROVIDER_SITE_OTHER): Payer: BC Managed Care – PPO | Admitting: Family Medicine

## 2021-05-03 VITALS — BP 125/77 | HR 67 | Temp 98.1°F | Resp 16 | Wt 215.0 lb

## 2021-05-03 DIAGNOSIS — I10 Essential (primary) hypertension: Secondary | ICD-10-CM

## 2021-05-03 DIAGNOSIS — G4733 Obstructive sleep apnea (adult) (pediatric): Secondary | ICD-10-CM | POA: Diagnosis not present

## 2021-05-03 DIAGNOSIS — E7800 Pure hypercholesterolemia, unspecified: Secondary | ICD-10-CM

## 2021-05-03 DIAGNOSIS — Z1389 Encounter for screening for other disorder: Secondary | ICD-10-CM | POA: Diagnosis not present

## 2021-05-03 LAB — POCT URINALYSIS DIPSTICK
Bilirubin, UA: NEGATIVE
Blood, UA: NEGATIVE
Glucose, UA: NEGATIVE
Ketones, UA: NEGATIVE
Leukocytes, UA: NEGATIVE
Nitrite, UA: NEGATIVE
Protein, UA: NEGATIVE
Spec Grav, UA: 1.01 (ref 1.010–1.025)
Urobilinogen, UA: 0.2 E.U./dL
pH, UA: 6.5 (ref 5.0–8.0)

## 2021-05-03 MED ORDER — LOSARTAN POTASSIUM 100 MG PO TABS
100.0000 mg | ORAL_TABLET | Freq: Every day | ORAL | 0 refills | Status: DC
Start: 2021-05-03 — End: 2021-05-03

## 2021-05-03 MED ORDER — LOSARTAN POTASSIUM 100 MG PO TABS: 100.0000 mg | ORAL_TABLET | Freq: Every day | ORAL | 3 refills | Status: DC

## 2021-05-03 NOTE — Progress Notes (Signed)
?  ? ? ?Established patient visit ? ?I,[name],acting as a scribe for [name], MD.,have documented all relevant documentation on the behalf of [name], MD,as directed by  [name], MD while in the presence of [name], MD. ? ? ?Patient: Zachary Hess   DOB: 1961-02-24   61 y.o. Male  MRN: [id number] ?Visit Date: 05/03/2021 ? ?Today's healthcare provider: [name], MD  ? ?Chief Complaint  ?Patient presents with  ? Follow-up  ? Hypertension  ? ?Subjective  ?  ?HPI  ?Patient coming in for recheck.  He is using CPAP at home.  He feels well.  Home blood pressures want run 120-140/75. ?Hypertension, follow-up ? ?BP Readings from Last 3 Encounters:  ?05/03/21 125/77  ?03/03/21 132/83  ?09/13/20 (!) 145/90  ? Wt Readings from Last 3 Encounters:  ?05/03/21 215 lb (97.5 kg)  ?09/13/20 222 lb (100.7 kg)  ?04/20/20 214 lb (97.1 kg)  ?  ? ?He was last seen for hypertension 8 months ago.  ?BP at that visit was 145/90.  ?Management since that visit includes; Increased losartan from 50 to 100 mg daily.  Continue to check home blood pressures. ? ?He reports good compliance with treatment. ?He is not having side effects. none ?He is following a Regular diet. ?He is exercising. ?He does not smoke. ? ?Use of agents associated with hypertension: none.  ? ?Outside blood pressures are 119/75-140/75. ? ?Pertinent labs: ?Lab Results  ?Component Value Date  ? CHOL 154 09/15/2020  ? HDL 54 09/15/2020  ? Secretary 82 09/15/2020  ? TRIG 95 09/15/2020  ? CHOLHDL 2.9 09/15/2020  ? Lab Results  ?Component Value Date  ? NA 137 09/15/2020  ? K 5.2 09/15/2020  ? CREATININE 1.06 09/15/2020  ? EGFR 79 09/15/2020  ? GLUCOSE 102 (H) 09/15/2020  ? TSH 1.530 09/15/2020  ?  ? ?The 10-year ASCVD risk score (Arnett DK, et al., 2019) is: 8.7%  ? ?--------------------------------------------------------------------------------------------------- ? ? ?Medications: ?Outpatient Medications Prior to Visit   ?Medication Sig  ? Ascorbic Acid (VITAMIN C) 1000 MG tablet Take 500 mg by mouth daily.  ? aspirin 81 MG tablet Take by mouth.  ? atorvastatin (LIPITOR) 40 MG tablet TAKE 1 TABLET AT BEDTIME  ? Cholecalciferol 1000 UNITS tablet Take by mouth.  ? Coenzyme Q10 (COQ10) 100 MG CAPS Take 200 mg by mouth 2 (two) times daily.  ? fluticasone (FLONASE) 50 MCG/ACT nasal spray Place into the nose.   ? Loratadine (CLARITIN PO) Take by mouth daily.  ? LYSINE PO Take by mouth.  ? Multiple Vitamins-Minerals (ZINC PO) Take by mouth.  ? Triamcinolone Acetonide (NASACORT AQ NA) Place into the nose.  ? triamcinolone cream (KENALOG) 0.1 %   ? venlafaxine XR (EFFEXOR-XR) 75 MG 24 hr capsule TAKE 1 CAPSULE (75 MG TOTAL) BY MOUTH DAILY WITH BREAKFAST.  ? [DISCONTINUED] losartan (COZAAR) 100 MG tablet Take 1 tablet (100 mg total) by mouth daily.  ? diclofenac (VOLTAREN) 75 MG EC tablet TAKE 1 TABLET BY MOUTH TWICE A DAY (Patient not taking: Reported on 05/03/2021)  ? [DISCONTINUED] oxyCODONE-acetaminophen (PERCOCET) 5-325 MG tablet Take 1 tablet by mouth every 4 (four) hours as needed for severe pain.  ? ?No facility-administered medications prior to visit.  ? ? ?Review of Systems ? ?  ?  Objective  ?  ?BP 125/77 (BP Location: Left Arm, Patient Position: Sitting, Cuff Size: Large)   Pulse 67   Temp 98.1 ?F (36.7 ?C) (Temporal)  Resp 16   Wt 215 lb (97.5 kg)   SpO2 97%   BMI 26.87 kg/m?  ?BP Readings from Last 3 Encounters:  ?05/03/21 125/77  ?03/03/21 132/83  ?09/13/20 (!) 145/90  ? ?Wt Readings from Last 3 Encounters:  ?05/03/21 215 lb (97.5 kg)  ?09/13/20 222 lb (100.7 kg)  ?04/20/20 214 lb (97.1 kg)  ? ?  ? ?Physical Exam ?Vitals reviewed.  ?Constitutional:   ?   General: He is not in acute distress. ?   Appearance: He is well-developed.  ?HENT:  ?   Head: Normocephalic and atraumatic.  ?   Right Ear: Hearing normal.  ?   Left Ear: Hearing normal.  ?   Nose: Nose normal.  ?Eyes:  ?   General: Lids are normal. No scleral icterus.     ?   Right eye: No discharge.     ?   Left eye: No discharge.  ?   Conjunctiva/sclera: Conjunctivae normal.  ?Cardiovascular:  ?   Rate and Rhythm: Normal rate and regular rhythm.  ?   Heart sounds: Normal heart sounds.  ?Pulmonary:  ?   Effort: Pulmonary effort is normal. No respiratory distress.  ?Skin: ?   Findings: No lesion or rash.  ?Neurological:  ?   General: No focal deficit present.  ?   Mental Status: He is alert and oriented to person, place, and time.  ?Psychiatric:     ?   Mood and Affect: Mood normal.     ?   Speech: Speech normal.     ?   Behavior: Behavior normal.     ?   Thought Content: Thought content normal.     ?   Judgment: Judgment normal.  ?  ? ? ?Results for orders placed or performed in visit on 05/03/21  ?POCT urinalysis dipstick  ?Result Value Ref Range  ? Color, UA Yellow   ? Clarity, UA Clear   ? Glucose, UA Negative Negative  ? Bilirubin, UA Negative   ? Ketones, UA Negative   ? Spec Grav, UA 1.010 1.010 - 1.025  ? Blood, UA Negative   ? pH, UA 6.5 5.0 - 8.0  ? Protein, UA Negative Negative  ? Urobilinogen, UA 0.2 0.2 or 1.0 E.U./dL  ? Nitrite, UA Negative   ? Leukocytes, UA Negative Negative  ? ? Assessment & Plan  ?  ? ?1. Essential (primary) hypertension ?Fairly good control with home blood pressure readings 1 01-7 40 systolic ?- losartan (COZAAR) 100 MG tablet; Take 1 tablet (100 mg total) by mouth daily.  Dispense: 90 tablet; Refill: 3 ? ?2. Screening for blood or protein in urine ? ?- POCT urinalysis dipstick ? ?3. Pure hypercholesterolemia ? ? ?4. Obstructive sleep apnea syndrome ?On CPAP nightly ? ?Return in about 5 months (around 09/21/2021).  ?   ? ?I, [name], MD, have reviewed all documentation for this visit. The documentation on 05/07/21 for the exam, diagnosis, procedures, and orders are all accurate and complete. ? ? ? ?Jabree Rebert Cranford Mon, MD  ?[facility] ?[phone] (phone) ?[phone] (fax) ? ?[facility] ?

## 2021-05-21 ENCOUNTER — Other Ambulatory Visit: Payer: Self-pay | Admitting: Family Medicine

## 2021-05-21 DIAGNOSIS — F32A Depression, unspecified: Secondary | ICD-10-CM

## 2021-05-26 ENCOUNTER — Other Ambulatory Visit: Payer: Self-pay | Admitting: Family Medicine

## 2021-05-26 DIAGNOSIS — I10 Essential (primary) hypertension: Secondary | ICD-10-CM

## 2021-09-21 ENCOUNTER — Ambulatory Visit (INDEPENDENT_AMBULATORY_CARE_PROVIDER_SITE_OTHER): Payer: BC Managed Care – PPO | Admitting: Family Medicine

## 2021-09-21 VITALS — BP 126/76 | HR 65 | Temp 98.1°F | Wt 204.0 lb

## 2021-09-21 DIAGNOSIS — E7800 Pure hypercholesterolemia, unspecified: Secondary | ICD-10-CM

## 2021-09-21 DIAGNOSIS — Z0000 Encounter for general adult medical examination without abnormal findings: Secondary | ICD-10-CM | POA: Diagnosis not present

## 2021-09-21 DIAGNOSIS — Z125 Encounter for screening for malignant neoplasm of prostate: Secondary | ICD-10-CM | POA: Diagnosis not present

## 2021-09-21 DIAGNOSIS — Z1211 Encounter for screening for malignant neoplasm of colon: Secondary | ICD-10-CM

## 2021-09-21 DIAGNOSIS — G4733 Obstructive sleep apnea (adult) (pediatric): Secondary | ICD-10-CM | POA: Diagnosis not present

## 2021-09-21 DIAGNOSIS — I10 Essential (primary) hypertension: Secondary | ICD-10-CM

## 2021-09-21 DIAGNOSIS — F32A Depression, unspecified: Secondary | ICD-10-CM

## 2021-09-21 NOTE — Addendum Note (Signed)
Addended by: Althea Charon D on: 09/21/2021 03:29 PM   Modules accepted: Orders

## 2021-09-21 NOTE — Progress Notes (Signed)
Zachary Hess,acting as a scribe for Zachary Durie, MD.,have documented all relevant documentation on the behalf of Zachary Durie, MD,as directed by  Zachary Durie, MD while in the presence of Zachary Durie, MD.    Complete physical exam   Patient: Zachary Hess   DOB: 1960-10-01   61 y.o. Male  MRN: [id number] Visit Date: 09/21/2021  Today's healthcare provider: [name], MD   No chief complaint on file.  Subjective    Zachary Hess is a 61 y.o. male who presents today for a complete physical exam.  Overall he is feeling well.  He has lost 15 pounds intentionally with weight watchers.  HPI  Patient complains of left shoulder and neck pain. Patient took Diclofenac after toe surgery and wondering if going back on this would help.  The left shoulder is improved when he takes diclofenac.  Tylenol also helps some.  He is ready to see orthopedics for this. He needs referral back to GI for follow-up colonoscopy He would like to switch his CPAP to nasal prongs to avoid irritating the top of his nose. He did have a recent life screening done that showed mild carotid stenosis.  His hypertension is treated and he is on 40 of Lipitor daily.  He is completely asymptomatic for any cerebrovascular or cardiovascular disease.  Past Medical History:  Diagnosis Date   Anxiety    HBP (high blood pressure)    Sleep apnea 01/03/2017   Past Surgical History:  Procedure Laterality Date   ANAL FISSURE REPAIR     COLONOSCOPY WITH PROPOFOL N/A 10/07/2018   Procedure: COLONOSCOPY WITH PROPOFOL;  Surgeon: [name], MD;  Location: [facility] ENDOSCOPY;  Service: Gastroenterology;  Laterality: N/A;   FOOT SURGERY Left    MANDIBLE FRACTURE SURGERY     MOUTH SURGERY     WISDOM TEETH   Social History   Socioeconomic History   Marital status: Married    Spouse name: Not on file   Number of children: Not on file   Years of education: Not on file   Highest education level:  Not on file  Occupational History   Not on file  Tobacco Use   Smoking status: Never   Smokeless tobacco: Never  Vaping Use   Vaping Use: Never used  Substance and Sexual Activity   Alcohol use: Yes    Alcohol/week: 2.0 standard drinks of alcohol    Types: 2 Standard drinks or equivalent per week    Comment: SOCIALLY   Drug use: No   Sexual activity: Not on file  Other Topics Concern   Not on file  Social History Narrative   Not on file   Social Determinants of Health   Financial Resource Strain: Not on file  Food Insecurity: Not on file  Transportation Needs: Not on file  Physical Activity: Not on file  Stress: Not on file  Social Connections: Not on file  Intimate Partner Violence: Not on file   Family Status  Relation Name Status   Father  Deceased at age 23   Mother  Alive   Sister  Alive   Brother  Zachary Hess  Deceased   MGM  Deceased at age 42   Brother  Alive   PGF  (Not Specified)   Family History  Problem Relation Age of Onset   Congestive Heart Failure Father    CAD Father    Heart attack Father  Diabetes Mother    Atrial fibrillation Mother    Heart attack Paternal Uncle    Heart disease Maternal Grandmother    Stroke Paternal Grandfather    No Known Allergies  Patient Care Team: Jerrol Banana., MD as PCP - General (Family Medicine)   Medications: Outpatient Medications Prior to Visit  Medication Sig   Ascorbic Acid (VITAMIN C) 1000 MG tablet Take 500 mg by mouth daily.   aspirin 81 MG tablet Take by mouth.   atorvastatin (LIPITOR) 40 MG tablet TAKE 1 TABLET AT BEDTIME   Cholecalciferol 1000 UNITS tablet Take by mouth.   Coenzyme Q10 (COQ10) 100 MG CAPS Take 200 mg by mouth 2 (two) times daily.   fluticasone (FLONASE) 50 MCG/ACT nasal spray Place into the nose.    Loratadine (CLARITIN PO) Take by mouth daily.   losartan (COZAAR) 100 MG tablet TAKE 1 TABLET BY MOUTH EVERY DAY   LYSINE PO Take by mouth.   Multiple  Vitamins-Minerals (ZINC PO) Take by mouth.   Triamcinolone Acetonide (NASACORT AQ NA) Place into the nose.   triamcinolone cream (KENALOG) 0.1 %    venlafaxine XR (EFFEXOR-XR) 75 MG 24 hr capsule TAKE 1 CAPSULE BY MOUTH DAILY WITH BREAKFAST.   [DISCONTINUED] diclofenac (VOLTAREN) 75 MG EC tablet TAKE 1 TABLET BY MOUTH TWICE A DAY (Patient not taking: Reported on 05/03/2021)   No facility-administered medications prior to visit.    Review of Systems  Constitutional: Negative.   HENT:  Positive for tinnitus.   Eyes: Negative.   Respiratory: Negative.    Cardiovascular: Negative.   Gastrointestinal: Negative.   Endocrine: Negative.   Genitourinary: Negative.   Musculoskeletal:  Positive for arthralgias.  Skin: Negative.   Allergic/Immunologic: Positive for environmental allergies.  Neurological: Negative.   Hematological: Negative.   Psychiatric/Behavioral: Negative.      Last lipids Lab Results  Component Value Date   CHOL 154 09/15/2020   HDL 54 09/15/2020   LDLCALC 82 09/15/2020   TRIG 95 09/15/2020   CHOLHDL 2.9 09/15/2020      Objective     There were no vitals taken for this visit. BP Readings from Last 3 Encounters:  09/21/21 126/76  05/03/21 125/77  03/03/21 132/83   Wt Readings from Last 3 Encounters:  09/21/21 204 lb (92.5 kg)  05/03/21 215 lb (97.5 kg)  09/13/20 222 lb (100.7 kg)       Physical Exam Constitutional:      Appearance: Normal appearance. He is normal weight.  HENT:     Head: Normocephalic and atraumatic.     Right Ear: Tympanic membrane, ear canal and external ear normal.     Left Ear: Tympanic membrane, ear canal and external ear normal.     Nose: Nose normal.     Mouth/Throat:     Mouth: Mucous membranes are moist.     Pharynx: Oropharynx is clear.  Eyes:     Extraocular Movements: Extraocular movements intact.     Conjunctiva/sclera: Conjunctivae normal.     Pupils: Pupils are equal, round, and reactive to light.   Cardiovascular:     Rate and Rhythm: Normal rate and regular rhythm.     Pulses: Normal pulses.     Heart sounds: Normal heart sounds.  Pulmonary:     Effort: Pulmonary effort is normal.     Breath sounds: Normal breath sounds.  Abdominal:     General: Abdomen is flat. Bowel sounds are normal.     Palpations: Abdomen is  soft.  Genitourinary:    Penis: Normal.      Testes: Normal.  Musculoskeletal:        General: Normal range of motion.     Cervical back: Neck supple.  Skin:    General: Skin is warm and dry.     Comments: Fair skin.  Neurological:     General: No focal deficit present.     Mental Status: He is alert and oriented to person, place, and time. Mental status is at baseline.  Psychiatric:        Mood and Affect: Mood normal.        Behavior: Behavior normal.        Thought Content: Thought content normal.        Judgment: Judgment normal.       Last depression screening scores    09/21/2021    2:06 PM 09/13/2020    3:44 PM 04/20/2020    8:20 AM  PHQ 2/9 Scores  PHQ - 2 Score 0 0 0  PHQ- 9 Score 1 0 1   Last fall risk screening    09/21/2021    2:06 PM  [place] in the past year? 0   Last Audit-C alcohol use screening    09/21/2021    2:06 PM  Alcohol Use Disorder Test (AUDIT)  1. How often do you have a drink containing alcohol? 0   A score of 3 or more in women, and 4 or more in men indicates increased risk for alcohol abuse, EXCEPT if all of the points are from question 1   No results found for any visits on 09/21/21.  Assessment & Plan    Routine Health Maintenance and Physical Exam  Exercise Activities and Dietary recommendations  Goals   None     Immunization History  Administered Date(s) Administered   Hepatitis A 01/24/2011, 03/13/2011, 07/24/2011   Hepatitis B 01/24/2011, 03/13/2011, 07/24/2011   Influenza Split 02/25/2012   Influenza,inj,Quad PF,6+ Mos 01/21/2013, 11/25/2013, 11/29/2014, 12/13/2015, 01/03/2017,  01/09/2018   PFIZER(Purple Top)SARS-COV-2 Vaccination 12/22/2019   Td 10/04/2003   Tdap 11/25/2013   Typhoid Inactivated 03/11/2011   Zoster Recombinat (Shingrix) 08/17/2019, 10/19/2019    Health Maintenance  Topic Date Due   HIV Screening  Never done   COVID-19 Vaccine (2 - Pfizer series) 02/16/2020   INFLUENZA VACCINE  09/26/2021   COLONOSCOPY (Pts 45-31yr Insurance coverage will need to be confirmed)  10/06/2021   TETANUS/TDAP  11/26/2023   Hepatitis C Screening  Completed   Zoster Vaccines- Shingrix  Completed   HPV VACCINES  Aged Out    Discussed health benefits of physical activity, and encouraged him to engage in regular exercise appropriate for his age and condition.  1. Annual physical exam Refer for follow up colonoscopy. Follow-up 1 year physical - CBC with Differential/Platelet - Comprehensive metabolic panel - Lipid Panel With LDL/HDL Ratio - TSH  2. Prostate cancer screening  - PSA  3. Obstructive sleep apnea syndrome Change CPAP mask to nasal prongs.  4. Pure hypercholesterolemia Continue to treat as aggressively as possible with high-dose atorvastatin 40  5. Mild depression Continue venlafaxine indefinitely  6. Essential (primary) hypertension Excellent control. 7.  Shoulder pain/arthropathy Continue diclofenac intermittently but we will go ahead and refer back to orthopedics.  No follow-ups on file.     I, RWilhemena Durie MD, have reviewed all documentation for this visit. The documentation on 09/21/21 for the exam, diagnosis, procedures, and orders are all  accurate and complete.    Deke Tilghman Cranford Mon, MD  [facility] [phone] (phone) [phone] (fax)  [place]

## 2021-09-22 ENCOUNTER — Other Ambulatory Visit: Payer: Self-pay

## 2021-09-22 ENCOUNTER — Telehealth: Payer: Self-pay

## 2021-09-22 DIAGNOSIS — Z86010 Personal history of colonic polyps: Secondary | ICD-10-CM

## 2021-09-22 MED ORDER — NA SULFATE-K SULFATE-MG SULF 17.5-3.13-1.6 GM/177ML PO SOLN: 1.0000 | Freq: Once | ORAL | 0 refills | Status: AC

## 2021-09-22 NOTE — Telephone Encounter (Signed)
Gastroenterology Pre-Procedure Review  Request Date: 12/06/21 Requesting Physician: Dr. Vicente Males  PATIENT REVIEW QUESTIONS: The patient responded to the following health history questions as indicated:    1. Are you having any GI issues? no 2. Do you have a personal history of Polyps? yes (10/07/18 performed by Dr. Vicente Males) 3. Do you have a family history of Colon Cancer or Polyps? no 4. Diabetes Mellitus? no 5. Joint replacements in the past 12 months?no 6. Major health problems in the past 3 months?no 7. Any artificial heart valves, MVP, or defibrillator?no    MEDICATIONS & ALLERGIES:    Patient reports the following regarding taking any anticoagulation/antiplatelet therapy:   Plavix, Coumadin, Eliquis, Xarelto, Lovenox, Pradaxa, Brilinta, or Effient? no Aspirin? no  Patient confirms/reports the following medications:  Current Outpatient Medications  Medication Sig Dispense Refill   Ascorbic Acid (VITAMIN C) 1000 MG tablet Take 500 mg by mouth daily.     aspirin 81 MG tablet Take by mouth.     atorvastatin (LIPITOR) 40 MG tablet TAKE 1 TABLET AT BEDTIME 90 tablet 3   Cholecalciferol 1000 UNITS tablet Take by mouth.     Coenzyme Q10 (COQ10) 100 MG CAPS Take 200 mg by mouth 2 (two) times daily.     fluticasone (FLONASE) 50 MCG/ACT nasal spray Place into the nose.      Loratadine (CLARITIN PO) Take by mouth daily.     losartan (COZAAR) 100 MG tablet TAKE 1 TABLET BY MOUTH EVERY DAY 90 tablet 3   LYSINE PO Take by mouth.     Multiple Vitamins-Minerals (ZINC PO) Take by mouth.     Triamcinolone Acetonide (NASACORT AQ NA) Place into the nose.     triamcinolone cream (KENALOG) 0.1 %      venlafaxine XR (EFFEXOR-XR) 75 MG 24 hr capsule TAKE 1 CAPSULE BY MOUTH DAILY WITH BREAKFAST. 90 capsule 1   No current facility-administered medications for this visit.    Patient confirms/reports the following allergies:  No Known Allergies  No orders of the defined types were placed in this  encounter.   AUTHORIZATION INFORMATION Primary Insurance: 1D#: Group #:  Secondary Insurance: 1D#: Group #:  SCHEDULE INFORMATION: Date: 12/06/21 Time: Location: [facility]

## 2021-09-27 DIAGNOSIS — I10 Essential (primary) hypertension: Secondary | ICD-10-CM | POA: Diagnosis not present

## 2021-09-27 DIAGNOSIS — Z125 Encounter for screening for malignant neoplasm of prostate: Secondary | ICD-10-CM | POA: Diagnosis not present

## 2021-09-27 DIAGNOSIS — E7800 Pure hypercholesterolemia, unspecified: Secondary | ICD-10-CM | POA: Diagnosis not present

## 2021-09-27 DIAGNOSIS — G4733 Obstructive sleep apnea (adult) (pediatric): Secondary | ICD-10-CM | POA: Diagnosis not present

## 2021-09-27 DIAGNOSIS — Z0000 Encounter for general adult medical examination without abnormal findings: Secondary | ICD-10-CM | POA: Diagnosis not present

## 2021-09-28 LAB — COMPREHENSIVE METABOLIC PANEL
ALT: 22 IU/L (ref 0–44)
AST: 27 IU/L (ref 0–40)
Albumin/Globulin Ratio: 2 (ref 1.2–2.2)
Albumin: 4.7 g/dL (ref 3.9–4.9)
Alkaline Phosphatase: 84 IU/L (ref 44–121)
BUN/Creatinine Ratio: 18 (ref 10–24)
BUN: 18 mg/dL (ref 8–27)
Bilirubin Total: 0.8 mg/dL (ref 0.0–1.2)
CO2: 25 mmol/L (ref 20–29)
Calcium: 10.1 mg/dL (ref 8.6–10.2)
Chloride: 99 mmol/L (ref 96–106)
Creatinine, Ser: 1 mg/dL (ref 0.76–1.27)
Globulin, Total: 2.3 g/dL (ref 1.5–4.5)
Glucose: 92 mg/dL (ref 70–99)
Potassium: 5.1 mmol/L (ref 3.5–5.2)
Sodium: 136 mmol/L (ref 134–144)
Total Protein: 7 g/dL (ref 6.0–8.5)
eGFR: 85 mL/min/{1.73_m2} (ref >59)

## 2021-09-28 LAB — CBC WITH DIFFERENTIAL/PLATELET
Basophils Absolute: 0.1 10*3/uL (ref 0.0–0.2)
Basos: 1 %
EOS (ABSOLUTE): 0.2 10*3/uL (ref 0.0–0.4)
Eos: 3 %
Hematocrit: 36.2 % — ABNORMAL LOW (ref 37.5–51.0)
Hemoglobin: 12.7 g/dL — ABNORMAL LOW (ref 13.0–17.7)
Immature Grans (Abs): 0 10*3/uL (ref 0.0–0.1)
Immature Granulocytes: 0 %
Lymphocytes Absolute: 1.8 10*3/uL (ref 0.7–3.1)
Lymphs: 35 %
MCH: 31 pg (ref 26.6–33.0)
MCHC: 35.1 g/dL (ref 31.5–35.7)
MCV: 88 fL (ref 79–97)
Monocytes Absolute: 0.5 10*3/uL (ref 0.1–0.9)
Monocytes: 9 %
Neutrophils Absolute: 2.6 10*3/uL (ref 1.4–7.0)
Neutrophils: 52 %
Platelets: 226 10*3/uL (ref 150–450)
RBC: 4.1 x10E6/uL — ABNORMAL LOW (ref 4.14–5.80)
RDW: 11.8 % (ref 11.6–15.4)
WBC: 5.1 10*3/uL (ref 3.4–10.8)

## 2021-09-28 LAB — PSA: Prostate Specific Ag, Serum: 1.6 ng/mL (ref 0.0–4.0)

## 2021-09-28 LAB — LIPID PANEL WITH LDL/HDL RATIO
Cholesterol, Total: 129 mg/dL (ref 100–199)
HDL: 56 mg/dL (ref >39)
LDL Chol Calc (NIH): 60 mg/dL (ref 0–99)
LDL/HDL Ratio: 1.1 ratio (ref 0.0–3.6)
Triglycerides: 64 mg/dL (ref 0–149)
VLDL Cholesterol Cal: 13 mg/dL (ref 5–40)

## 2021-09-28 LAB — TSH: TSH: 2.36 u[IU]/mL (ref 0.450–4.500)

## 2021-10-06 ENCOUNTER — Telehealth: Payer: Self-pay

## 2021-10-06 NOTE — Telephone Encounter (Signed)
Copied from [place] [id number]. Topic: General - Other >> Oct 06, 2021 11:46 AM [place] B wrote: Reason for CRM: nina called in from [employer], needs info for cpap order, rx with pressure settings and use and benefit notes. She states she fxed this in Aug 9. She will fx this again today. Fx # is [phone]

## 2021-10-09 NOTE — Telephone Encounter (Signed)
Received fax.

## 2021-10-12 NOTE — Telephone Encounter (Signed)
Information sent to fax.

## 2021-11-09 ENCOUNTER — Telehealth: Payer: Self-pay

## 2021-11-09 NOTE — Telephone Encounter (Signed)
Returned patients call to reschedule his colonoscopy.  Colonoscopy has been rescheduled from 12/06/21 to 12/08/21 still with Dr. Vicente Males at [facility].    Trish in Endo has been notified.  Thanks, [place], [place]

## 2021-11-13 DIAGNOSIS — G4733 Obstructive sleep apnea (adult) (pediatric): Secondary | ICD-10-CM | POA: Diagnosis not present

## 2021-11-14 ENCOUNTER — Other Ambulatory Visit: Payer: Self-pay | Admitting: Family Medicine

## 2021-11-14 DIAGNOSIS — F32A Depression, unspecified: Secondary | ICD-10-CM

## 2021-11-15 NOTE — Telephone Encounter (Signed)
Requested Prescriptions  Pending Prescriptions Disp Refills  . venlafaxine XR (EFFEXOR-XR) 75 MG 24 hr capsule [Pharmacy Med Name: VENLAFAXINE HCL ER 75 MG CAP] 90 capsule 1    Sig: TAKE 1 CAPSULE BY MOUTH DAILY WITH BREAKFAST.     Psychiatry: Antidepressants - SNRI - desvenlafaxine & venlafaxine Failed - 11/14/2021  4:35 PM      Failed - Lipid Panel in normal range within the last 12 months    Cholesterol, Total  Date Value Ref Range Status  09/27/2021 129 100 - 199 mg/dL Final   LDL Chol Calc (NIH)  Date Value Ref Range Status  09/27/2021 60 0 - 99 mg/dL Final   HDL  Date Value Ref Range Status  09/27/2021 56 >39 mg/dL Final   Triglycerides  Date Value Ref Range Status  09/27/2021 64 0 - 149 mg/dL Final   Triglyceride fasting, serum  Date Value Ref Range Status  04/26/2009 107 mg/dL          Passed - Cr in normal range and within 360 days    Creatinine, Ser  Date Value Ref Range Status  09/27/2021 1.00 0.76 - 1.27 mg/dL Final         Passed - Completed PHQ-2 or PHQ-9 in the last 360 days      Passed - Last BP in normal range    BP Readings from Last 1 Encounters:  09/21/21 126/76         Passed - Valid encounter within last 6 months    Recent Outpatient Visits          1 month ago Annual physical exam   [facility] Jerrol Banana., MD   6 months ago Essential (primary) hypertension   [facility] Jerrol Banana., MD   1 year ago Annual physical exam   [facility] Jerrol Banana., MD   1 year ago Essential (primary) hypertension   [facility] Jerrol Banana., MD   2 years ago Essential (primary) hypertension   [facility] Jerrol Banana., MD      Future Appointments            In 10 months Jerrol Banana., MD [facility] [place]

## 2021-11-16 DIAGNOSIS — D225 Melanocytic nevi of trunk: Secondary | ICD-10-CM | POA: Diagnosis not present

## 2021-11-16 DIAGNOSIS — D2271 Melanocytic nevi of right lower limb, including hip: Secondary | ICD-10-CM | POA: Diagnosis not present

## 2021-11-16 DIAGNOSIS — D2261 Melanocytic nevi of right upper limb, including shoulder: Secondary | ICD-10-CM | POA: Diagnosis not present

## 2021-11-16 DIAGNOSIS — X32XXXA Exposure to sunlight, initial encounter: Secondary | ICD-10-CM | POA: Diagnosis not present

## 2021-11-16 DIAGNOSIS — D2262 Melanocytic nevi of left upper limb, including shoulder: Secondary | ICD-10-CM | POA: Diagnosis not present

## 2021-11-16 DIAGNOSIS — L570 Actinic keratosis: Secondary | ICD-10-CM | POA: Diagnosis not present

## 2021-11-27 ENCOUNTER — Telehealth: Payer: Self-pay

## 2021-11-27 NOTE — Telephone Encounter (Signed)
Patients call has been returned from Friday.  His instructions had the wrong date, therefore he wanted to confirm that he is scheduled for the 13th not the 16th as his instructions noted.  I apologized for the date error and informed patient that he is on for 12/08/21. Corrected the colonoscopy instructions sent them to him via mychart but will also mail out a new set of instructions.  Thanks,  [place], [place]

## 2021-12-08 ENCOUNTER — Ambulatory Visit: Payer: BC Managed Care – PPO | Admitting: General Practice

## 2021-12-08 ENCOUNTER — Ambulatory Visit
Admission: RE | Admit: 2021-12-08 | Discharge: 2021-12-08 | Disposition: A | Discharge status: Home / Self Care | Payer: BC Managed Care – PPO | Attending: Gastroenterology | Admitting: Gastroenterology

## 2021-12-08 ENCOUNTER — Encounter
Admission: RE | Disposition: A | Discharge status: Home / Self Care | Payer: Self-pay | Source: Home / Self Care | Attending: Gastroenterology

## 2021-12-08 DIAGNOSIS — Z8719 Personal history of other diseases of the digestive system: Secondary | ICD-10-CM | POA: Insufficient documentation

## 2021-12-08 DIAGNOSIS — I10 Essential (primary) hypertension: Secondary | ICD-10-CM | POA: Diagnosis not present

## 2021-12-08 DIAGNOSIS — Z86010 Personal history of colonic polyps: Secondary | ICD-10-CM | POA: Diagnosis not present

## 2021-12-08 DIAGNOSIS — D120 Benign neoplasm of cecum: Secondary | ICD-10-CM | POA: Diagnosis not present

## 2021-12-08 DIAGNOSIS — I2510 Atherosclerotic heart disease of native coronary artery without angina pectoris: Secondary | ICD-10-CM | POA: Diagnosis not present

## 2021-12-08 DIAGNOSIS — D126 Benign neoplasm of colon, unspecified: Secondary | ICD-10-CM | POA: Diagnosis not present

## 2021-12-08 DIAGNOSIS — Z09 Encounter for follow-up examination after completed treatment for conditions other than malignant neoplasm: Secondary | ICD-10-CM | POA: Diagnosis present

## 2021-12-08 DIAGNOSIS — G4730 Sleep apnea, unspecified: Secondary | ICD-10-CM | POA: Diagnosis not present

## 2021-12-08 DIAGNOSIS — Z1211 Encounter for screening for malignant neoplasm of colon: Secondary | ICD-10-CM | POA: Diagnosis not present

## 2021-12-08 DIAGNOSIS — K635 Polyp of colon: Secondary | ICD-10-CM | POA: Diagnosis not present

## 2021-12-08 HISTORY — PX: COLONOSCOPY WITH PROPOFOL: SHX5780

## 2021-12-08 SURGERY — COLONOSCOPY WITH PROPOFOL
Anesthesia: General

## 2021-12-08 MED ORDER — EPHEDRINE SULFATE (PRESSORS) 50 MG/ML IJ SOLN
INTRAMUSCULAR | Status: DC | PRN
  Administered 2021-12-08 (×2): 10 mg via INTRAVENOUS

## 2021-12-08 MED ORDER — EPHEDRINE 5 MG/ML INJ
INTRAVENOUS | Status: AC
  Filled 2021-12-08: qty 5

## 2021-12-08 MED ORDER — PROPOFOL 10 MG/ML IV BOLUS
INTRAVENOUS | Status: DC | PRN
  Administered 2021-12-08: 40 mg via INTRAVENOUS
  Administered 2021-12-08: 120 mg via INTRAVENOUS
  Administered 2021-12-08 (×3): 40 mg via INTRAVENOUS

## 2021-12-08 MED ORDER — SODIUM CHLORIDE 0.9 % IV SOLN
INTRAVENOUS | Status: DC
  Administered 2021-12-08: 20 mL/h via INTRAVENOUS

## 2021-12-08 NOTE — Op Note (Signed)
[facility] Gastroenterology Patient Name: Zachary Hess Procedure Date: 12/08/2021 9:23 AM MRN: [id number] Account #: [id number] Date of Birth: 1961-01-28 Admit Type: Outpatient Age: 60 Room: [facility] ENDO ROOM 3 Gender: Male Note Status: Finalized Instrument Name: Jasper Riling [id number] Procedure:             Colonoscopy Indications:           Surveillance: Personal history of adenomatous polyps                         on last colonoscopy 3 years ago Providers:             [name] MD, MD Referring MD:          Janine Ores. Rosanna Randy, MD (Referring MD) Medicines:             Monitored Anesthesia Care Complications:         No immediate complications. Procedure:             Pre-Anesthesia Assessment:                        - Prior to the procedure, a History and Physical was                         performed, and patient medications, allergies and                         sensitivities were reviewed. The patient's tolerance                         of previous anesthesia was reviewed.                        - The risks and benefits of the procedure and the                         sedation options and risks were discussed with the                         patient. All questions were answered and informed                         consent was obtained.                        - ASA Grade Assessment: II - A patient with mild                         systemic disease.                        After obtaining informed consent, the colonoscope was                         passed under direct vision. Throughout the procedure,                         the patient's blood pressure, pulse, and oxygen  saturations were monitored continuously. The                         Colonoscope was introduced through the anus and                         advanced to the the cecum, identified by appendiceal                         orifice and ileocecal valve. The colonoscopy was                          performed with ease. The patient tolerated the                         procedure well. The quality of the bowel preparation                         was good. Findings:      The perianal and digital rectal examinations were normal.      A 3 mm polyp was found in the cecum. The polyp was sessile. The polyp       was removed with a jumbo cold forceps. Resection and retrieval were       complete.      The exam was otherwise without abnormality on direct and retroflexion       views. Impression:            - One 3 mm polyp in the cecum, removed with a jumbo                         cold forceps. Resected and retrieved.                        - The examination was otherwise normal on direct and                         retroflexion views. Recommendation:        - Discharge patient to home (with escort).                        - Resume previous diet.                        - Continue present medications.                        - Await pathology results.                        - Repeat colonoscopy in 7 years for surveillance. Procedure Code(s):     --- Professional ---                        [id number], Colonoscopy, flexible; with biopsy, single or                         multiple Diagnosis Code(s):     --- Professional ---  Z86.010, Personal history of colonic polyps                        K63.5, Polyp of colon CPT copyright 2019 American Medical Association. All rights reserved. The codes documented in this report are preliminary and upon coder review may  be revised to meet current compliance requirements. [name], MD [name] MD, MD 12/08/2021 9:53:59 AM This report has been signed electronically. Number of Addenda: 0 Note Initiated On: 12/08/2021 9:23 AM Scope Withdrawal Time: 0 hours 10 minutes 58 seconds  Total Procedure Duration: 0 hours 13 minutes 32 seconds  Estimated Blood Loss:  Estimated blood loss: none.      [facility]

## 2021-12-08 NOTE — Transfer of Care (Signed)
Immediate Anesthesia Transfer of Care Note  Patient: [name]  Procedure(s) Performed: COLONOSCOPY WITH PROPOFOL  Patient Location: Endoscopy Unit  Anesthesia Type:General  Level of Consciousness: drowsy  Airway & Oxygen Therapy: Patient Spontanous Breathing and Patient connected to nasal cannula oxygen  Post-op Assessment: Report given to RN, Post -op Vital signs reviewed and stable and Patient moving all extremities  Post vital signs: Reviewed and stable  Last Vitals:  Vitals Value Taken Time  BP 96/51 12/08/21 0955  Temp 35.7 C 12/08/21 0955  Pulse 69 12/08/21 0956  Resp 8 12/08/21 0956  SpO2 100 % 12/08/21 0956  Vitals shown include unvalidated device data.  Last Pain:  Vitals:   12/08/21 0955  TempSrc: Temporal  PainSc: 0-No pain         Complications: No notable events documented.

## 2021-12-08 NOTE — H&P (Signed)
[name], MD [address], [place], [place], [place], [address] [address], [place], [place], [place], [address] Phone: [phone]  Fax: [phone]  Primary Care Physician:  Jerrol Banana., MD   Pre-Procedure History & Physical: HPI:  Zachary Hess is a 61 y.o. male is here for an colonoscopy.   Past Medical History:  Diagnosis Date   Anxiety    HBP (high blood pressure)    Sleep apnea 01/03/2017    Past Surgical History:  Procedure Laterality Date   ANAL FISSURE REPAIR     COLONOSCOPY WITH PROPOFOL N/A 10/07/2018   Procedure: COLONOSCOPY WITH PROPOFOL;  Surgeon: [name], MD;  Location: [facility] ENDOSCOPY;  Service: Gastroenterology;  Laterality: N/A;   FOOT SURGERY Left    MANDIBLE FRACTURE SURGERY     MOUTH SURGERY     WISDOM TEETH    Prior to Admission medications   Medication Sig Start Date End Date Taking? Authorizing Provider  Ascorbic Acid (VITAMIN C) 1000 MG tablet Take 500 mg by mouth daily.   Yes [provider]  aspirin 81 MG tablet Take by mouth.   Yes [provider]  atorvastatin (LIPITOR) 40 MG tablet TAKE 1 TABLET AT BEDTIME 01/25/21  Yes Jerrol Banana., MD  Cholecalciferol 1000 UNITS tablet Take by mouth.   Yes [provider]  Coenzyme Q10 (COQ10) 100 MG CAPS Take 200 mg by mouth 2 (two) times daily. 05/11/10  Yes [provider]  fluticasone (FLONASE) 50 MCG/ACT nasal spray Place into the nose.    Yes [provider]  Loratadine (CLARITIN PO) Take by mouth daily.   Yes [provider]  losartan (COZAAR) 100 MG tablet TAKE 1 TABLET BY MOUTH EVERY DAY 05/26/21  Yes Jerrol Banana., MD  LYSINE PO Take by mouth.   Yes [provider]  Multiple Vitamins-Minerals (ZINC PO) Take by mouth.   Yes [provider]  Triamcinolone Acetonide (NASACORT AQ NA) Place into the nose.   Yes [provider]  triamcinolone cream (KENALOG) 0.1 %  06/02/16  Yes  [provider]  venlafaxine XR (EFFEXOR-XR) 75 MG 24 hr capsule TAKE 1 CAPSULE BY MOUTH DAILY WITH BREAKFAST. 11/15/21  Yes Jerrol Banana., MD    Allergies as of 09/22/2021   (No Known Allergies)    Family History  Problem Relation Age of Onset   Congestive Heart Failure Father    CAD Father    Heart attack Father    Diabetes Mother    Atrial fibrillation Mother    Heart attack Paternal Uncle    Heart disease Maternal Grandmother    Stroke Paternal Grandfather     Social History   Socioeconomic History   Marital status: Married    Spouse name: Not on file   Number of children: Not on file   Years of education: Not on file   Highest education level: Not on file  Occupational History   Not on file  Tobacco Use   Smoking status: Never   Smokeless tobacco: Never  Vaping Use   Vaping Use: Never used  Substance and Sexual Activity   Alcohol use: Yes    Alcohol/week: 2.0 standard drinks of alcohol    Types: 2 Standard drinks or equivalent per week    Comment: SOCIALLY   Drug use: No   Sexual activity: Not on file  Other Topics Concern   Not on file  Social History Narrative   Not  on file   Social Determinants of Health   Financial Resource Strain: Not on file  Food Insecurity: Not on file  Transportation Needs: Not on file  Physical Activity: Not on file  Stress: Not on file  Social Connections: Not on file  Intimate Partner Violence: Not on file    Review of Systems: See HPI, otherwise negative ROS  Physical Exam: BP 123/85   Pulse 63   Temp (!) 96 F (35.6 C) (Temporal)   Resp 20   Ht '6\' 3"'$  (1.905 m)   Wt 90.7 kg   SpO2 99%   BMI 25.00 kg/m  General:   Alert,  pleasant and cooperative in NAD Head:  Normocephalic and atraumatic. Neck:  Supple; no masses or thyromegaly. Lungs:  Clear throughout to auscultation, normal respiratory effort.    Heart:  +S1, +S2, Regular rate and rhythm, No edema. Abdomen:  Soft, nontender and  nondistended. Normal bowel sounds, without guarding, and without rebound.   Neurologic:  Alert and  oriented x4;  grossly normal neurologically.  Impression/Plan: Zachary Hess is here for an colonoscopy to be performed for surveillance due to prior history of colon polyps   Risks, benefits, limitations, and alternatives regarding  colonoscopy have been reviewed with the patient.  Questions have been answered.  All parties agreeable.   [name], MD  12/08/2021, 9:22 AM

## 2021-12-08 NOTE — Anesthesia Preprocedure Evaluation (Signed)
Anesthesia Evaluation  Patient identified by MRN, date of birth, ID band Patient awake    Reviewed: Allergy & Precautions, NPO status , Patient's Chart, lab work & pertinent test results  Airway Mallampati: I  TM Distance: >3 FB Neck ROM: full    Dental  (+) Dental Advidsory Given, Teeth Intact   Pulmonary neg shortness of breath, sleep apnea and Continuous Positive Airway Pressure Ventilation ,    Pulmonary exam normal        Cardiovascular hypertension, (-) angina+ CAD  (-) Past MI and (-) CABG Normal cardiovascular exam     Neuro/Psych PSYCHIATRIC DISORDERS negative neurological ROS     GI/Hepatic negative GI ROS, Neg liver ROS,   Endo/Other  negative endocrine ROS  Renal/GU negative Renal ROS  negative genitourinary   Musculoskeletal   Abdominal   Peds  Hematology negative hematology ROS (+)   Anesthesia Other Findings Past Medical History: No date: Anxiety No date: HBP (high blood pressure) 01/03/2017: Sleep apnea  Past Surgical History: No date: ANAL FISSURE REPAIR 10/07/2018: COLONOSCOPY WITH PROPOFOL; N/A     Comment:  Procedure: COLONOSCOPY WITH PROPOFOL;  Surgeon: [name], MD;  Location: [facility] ENDOSCOPY;  Service:               Gastroenterology;  Laterality: N/A; No date: FOOT SURGERY; Left No date: MANDIBLE FRACTURE SURGERY No date: MOUTH SURGERY     Comment:  WISDOM TEETH  BMI    Body Mass Index: 25.00 kg/m      Reproductive/Obstetrics negative OB ROS                             Anesthesia Physical Anesthesia Plan  ASA: 3  Anesthesia Plan: General   Post-op Pain Management: Minimal or no pain anticipated   Induction: Intravenous  PONV Risk Score and Plan: 3 and Propofol infusion, TIVA and Ondansetron  Airway Management Planned: Nasal Cannula  Additional Equipment: None  Intra-op Plan:   Post-operative Plan:   Informed Consent: I have  reviewed the patients History and Physical, chart, labs and discussed the procedure including the risks, benefits and alternatives for the proposed anesthesia with the patient or authorized representative who has indicated his/her understanding and acceptance.     Dental advisory given  Plan Discussed with: CRNA and Surgeon  Anesthesia Plan Comments: (Discussed risks of anesthesia with patient, including possibility of difficulty with spontaneous ventilation under anesthesia necessitating airway intervention, PONV, and rare risks such as cardiac or respiratory or neurological events, and allergic reactions. Discussed the role of CRNA in patient's perioperative care. Patient understands.)        Anesthesia Quick Evaluation

## 2021-12-08 NOTE — Anesthesia Postprocedure Evaluation (Signed)
Anesthesia Post Note  Patient: Zachary Hess  Procedure(s) Performed: COLONOSCOPY WITH PROPOFOL  Patient location during evaluation: Endoscopy Anesthesia Type: General Level of consciousness: awake and alert Pain management: pain level controlled Vital Signs Assessment: post-procedure vital signs reviewed and stable Respiratory status: spontaneous breathing, nonlabored ventilation, respiratory function stable and patient connected to nasal cannula oxygen Cardiovascular status: blood pressure returned to baseline and stable Postop Assessment: no apparent nausea or vomiting Anesthetic complications: no   No notable events documented.   Last Vitals:  Vitals:   12/08/21 1005 12/08/21 1015  BP: 113/69 105/66  Pulse:    Resp:    Temp:    SpO2:      Last Pain:  Vitals:   12/08/21 1015  TempSrc:   PainSc: 0-No pain                 Dimas Millin

## 2021-12-11 ENCOUNTER — Encounter: Payer: Self-pay | Admitting: Gastroenterology

## 2021-12-11 LAB — SURGICAL PATHOLOGY

## 2021-12-13 ENCOUNTER — Encounter: Payer: Self-pay | Admitting: Gastroenterology

## 2021-12-13 DIAGNOSIS — G4733 Obstructive sleep apnea (adult) (pediatric): Secondary | ICD-10-CM | POA: Diagnosis not present

## 2021-12-19 DIAGNOSIS — G4733 Obstructive sleep apnea (adult) (pediatric): Secondary | ICD-10-CM | POA: Diagnosis not present

## 2022-01-01 IMAGING — CR DG FINGER THUMB 2+V*L*
1 series · 4 of 4 positions shown · non-contrast
Comparison: None.

CLINICAL DATA: Thumb pain

EXAM:
LEFT THUMB 2+V

[Series 1: dg finger thumb left · 0.14mm/px · 4 of 4 slices shown]
[im 1/4]
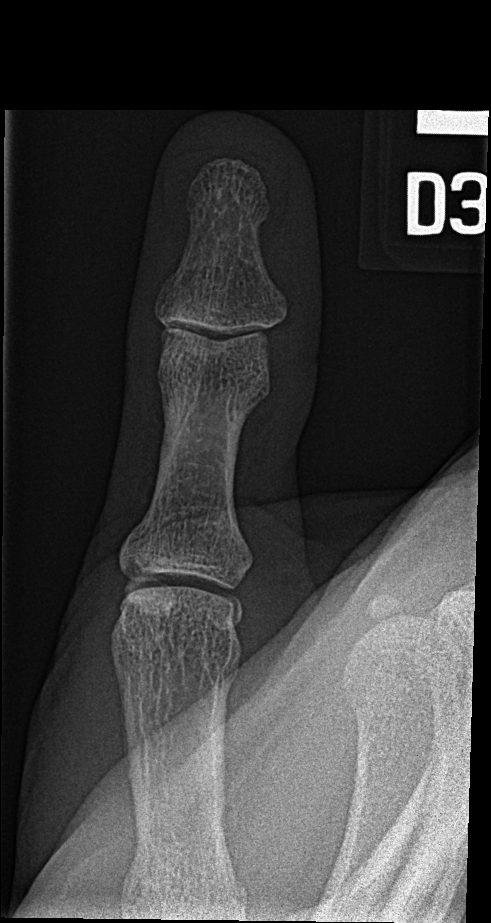
[im 2/4]
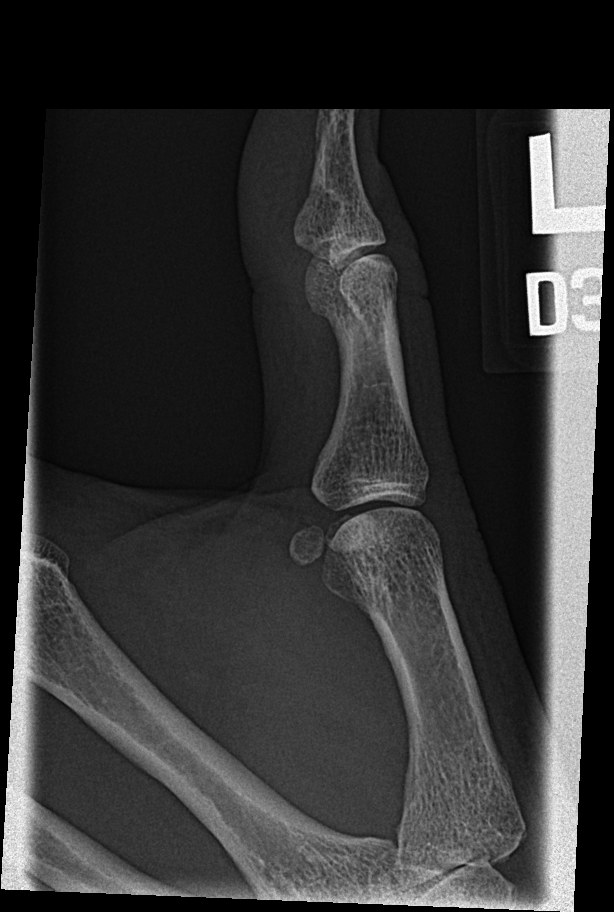
[im 3/4]
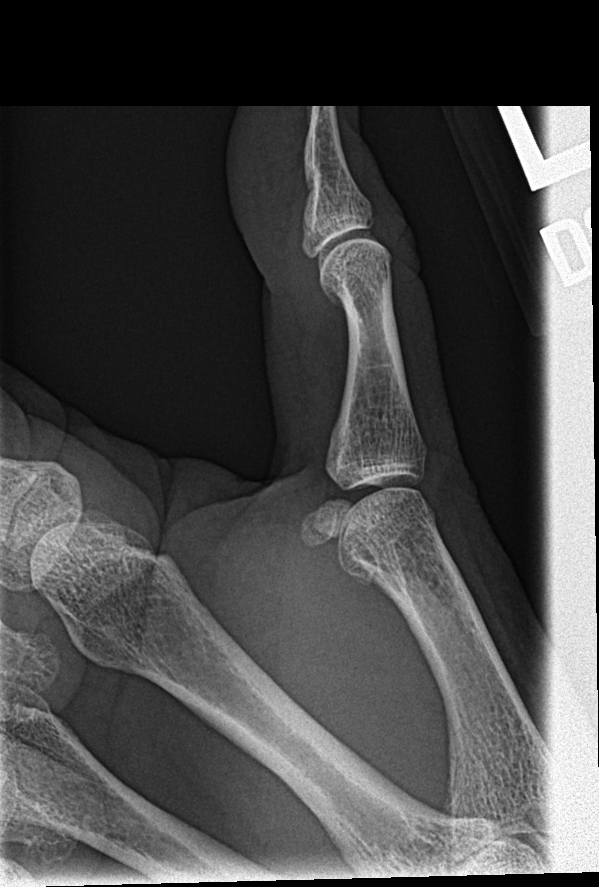
[im 4/4]
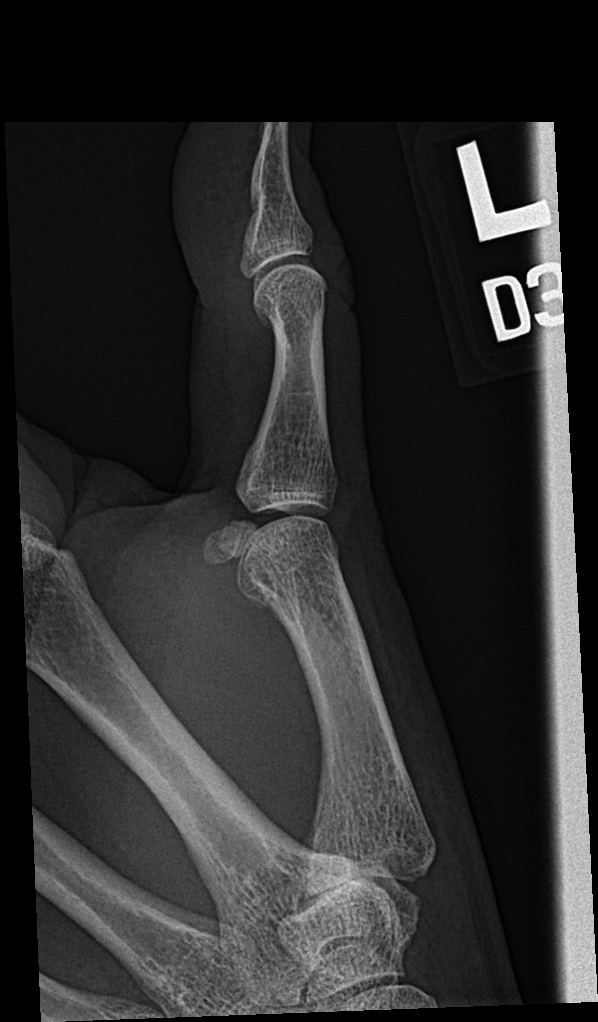

[4 of 4 positions shown; findings below may reference images not displayed]

FINDINGS: There is no evidence of fracture or dislocation. There is no
evidence of arthropathy or other focal bone abnormality. Soft
tissues are unremarkable.
IMPRESSION: Negative.

## 2022-01-08 ENCOUNTER — Ambulatory Visit (INDEPENDENT_AMBULATORY_CARE_PROVIDER_SITE_OTHER): Payer: BC Managed Care – PPO | Admitting: Family Medicine

## 2022-01-08 ENCOUNTER — Encounter: Payer: Self-pay | Admitting: Family Medicine

## 2022-01-08 VITALS — BP 116/84 | HR 66 | Temp 98.0°F | Resp 16 | Wt 210.0 lb

## 2022-01-08 DIAGNOSIS — J069 Acute upper respiratory infection, unspecified: Secondary | ICD-10-CM | POA: Diagnosis not present

## 2022-01-08 MED ORDER — PREDNISONE 10 MG (21) PO TBPK: ORAL_TABLET | ORAL | 0 refills | Status: DC

## 2022-01-08 NOTE — Progress Notes (Signed)
    SUBJECTIVE:   CHIEF COMPLAINT / HPI:   UPPER RESPIRATORY TRACT INFECTION - symptom onset ~1 week.  Fever: no Cough: yes, mucus productive Shortness of breath: no Chest pain: no Chest tightness: no Chest congestion: yes Nasal congestion: yes Runny nose: yes Sinus pressure: yes Face pain: no Ear pain: no  Vomiting: no Sick contacts: no, wife now with similar sx.  Context: stable Recurrent sinusitis: no Relief with OTC cold/cough medications: no  Treatments attempted: theraflu, mucinex, nyquil    OBJECTIVE:   BP 116/84 (BP Location: Right Arm, Patient Position: Sitting, Cuff Size: Large)   Pulse 66   Temp 98 F (36.7 C) (Oral)   Resp 16   Wt 210 lb (95.3 kg)   SpO2 99%   BMI 26.25 kg/m   Gen: well appearing, in NAD HEENT: orophyarynx clear without exudate or erythema. Uvula midline. No tonsillar enlargement. Good dentition. TM visible b/l without bulging, erythema, purulence. No cervical or supraclavicular lymphadenopathy. Maxillary and frontal sinuses nonTTP Card: RRR Lungs: CTAB Ext: WWP, no edema   ASSESSMENT/PLAN:   VIRAL URI Mild-mod sx. Outside of treatment and quarantine window for COVID, will defer testing. Given no relief with OTC decongestant, will provide steroid taper. Reviewed OTC symptom relief, return and emergency precautions.     Myles Gip, DO

## 2022-01-13 DIAGNOSIS — G4733 Obstructive sleep apnea (adult) (pediatric): Secondary | ICD-10-CM | POA: Diagnosis not present

## 2022-01-19 DIAGNOSIS — G4733 Obstructive sleep apnea (adult) (pediatric): Secondary | ICD-10-CM | POA: Diagnosis not present

## 2022-02-01 ENCOUNTER — Other Ambulatory Visit: Payer: Self-pay

## 2022-02-01 DIAGNOSIS — E7849 Other hyperlipidemia: Secondary | ICD-10-CM

## 2022-02-01 MED ORDER — ATORVASTATIN CALCIUM 40 MG PO TABS: 40.0000 mg | ORAL_TABLET | Freq: Every day | ORAL | 0 refills | Status: DC

## 2022-02-12 DIAGNOSIS — G4733 Obstructive sleep apnea (adult) (pediatric): Secondary | ICD-10-CM | POA: Diagnosis not present

## 2022-02-15 ENCOUNTER — Other Ambulatory Visit: Payer: Self-pay | Admitting: Family Medicine

## 2022-02-15 DIAGNOSIS — F32A Depression, unspecified: Secondary | ICD-10-CM

## 2022-02-15 MED ORDER — VENLAFAXINE HCL ER 75 MG PO CP24: 75.0000 mg | ORAL_CAPSULE | Freq: Every day | ORAL | 0 refills | Status: DC

## 2022-02-15 NOTE — Telephone Encounter (Signed)
Medication Refill - Medication: venlafaxine XR (EFFEXOR-XR) 75 MG 24 hr capsule   Has the patient contacted their pharmacy? Yes.   (Agent: If no, request that the patient contact the pharmacy for the refill. If patient does not wish to contact the pharmacy document the reason why and proceed with request.) (Agent: If yes, when and what did the pharmacy advise?)  Preferred Pharmacy (with phone number or street name):  [id number]  [address] Phone: [phone]: [phone]  Has the patient been seen for an appointment in the last year OR does the patient have an upcoming appointment? Yes.    Agent: Please be advised that RX refills may take up to 3 business days. We ask that you follow-up with your pharmacy.

## 2022-02-15 NOTE — Telephone Encounter (Signed)
Requested medication (s) are due for refill today: yes  Requested medication (s) are on the active medication list:yes  Last refill:  11/15/21  Future visit scheduled: no  Notes to clinic:  Unable to refill per protocol, last refill by provider no linger at the practice, routing for approval.     Requested Prescriptions  Pending Prescriptions Disp Refills   venlafaxine XR (EFFEXOR-XR) 75 MG 24 hr capsule 90 capsule 0    Sig: Take 1 capsule (75 mg total) by mouth daily with breakfast.     Psychiatry: Antidepressants - SNRI - desvenlafaxine & venlafaxine Failed - 02/15/2022  9:57 AM      Failed - Lipid Panel in normal range within the last 12 months    Cholesterol, Total  Date Value Ref Range Status  09/27/2021 129 100 - 199 mg/dL Final   LDL Chol Calc (NIH)  Date Value Ref Range Status  09/27/2021 60 0 - 99 mg/dL Final   HDL  Date Value Ref Range Status  09/27/2021 56 >39 mg/dL Final   Triglycerides  Date Value Ref Range Status  09/27/2021 64 0 - 149 mg/dL Final   Triglyceride fasting, serum  Date Value Ref Range Status  04/26/2009 107 mg/dL          Passed - Cr in normal range and within 360 days    Creatinine, Ser  Date Value Ref Range Status  09/27/2021 1.00 0.76 - 1.27 mg/dL Final         Passed - Completed PHQ-2 or PHQ-9 in the last 360 days      Passed - Last BP in normal range    BP Readings from Last 1 Encounters:  01/08/22 116/84         Passed - Valid encounter within last 6 months    Recent Outpatient Visits           1 month ago Viral URI   [facility] Myles Gip, DO   4 months ago Annual physical exam   [facility] Jerrol Banana., MD   9 months ago Essential (primary) hypertension   [facility] Jerrol Banana., MD   1 year ago Annual physical exam   [facility] Jerrol Banana., MD   1 year ago Essential (primary) hypertension   [facility] Jerrol Banana., MD       Future Appointments             In 7 months Jerrol Banana., MD [facility] [place]

## 2022-02-18 DIAGNOSIS — G4733 Obstructive sleep apnea (adult) (pediatric): Secondary | ICD-10-CM | POA: Diagnosis not present

## 2022-03-15 DIAGNOSIS — G4733 Obstructive sleep apnea (adult) (pediatric): Secondary | ICD-10-CM | POA: Diagnosis not present

## 2022-04-03 DIAGNOSIS — G4733 Obstructive sleep apnea (adult) (pediatric): Secondary | ICD-10-CM | POA: Diagnosis not present

## 2022-04-15 DIAGNOSIS — G4733 Obstructive sleep apnea (adult) (pediatric): Secondary | ICD-10-CM | POA: Diagnosis not present

## 2022-04-24 ENCOUNTER — Other Ambulatory Visit: Payer: Self-pay | Admitting: Family Medicine

## 2022-04-24 DIAGNOSIS — E7849 Other hyperlipidemia: Secondary | ICD-10-CM

## 2022-05-02 DIAGNOSIS — G4733 Obstructive sleep apnea (adult) (pediatric): Secondary | ICD-10-CM | POA: Diagnosis not present

## 2022-05-13 ENCOUNTER — Other Ambulatory Visit: Payer: Self-pay | Admitting: Family Medicine

## 2022-05-13 DIAGNOSIS — F32A Depression, unspecified: Secondary | ICD-10-CM

## 2022-05-14 DIAGNOSIS — G4733 Obstructive sleep apnea (adult) (pediatric): Secondary | ICD-10-CM | POA: Diagnosis not present

## 2022-06-02 DIAGNOSIS — G4733 Obstructive sleep apnea (adult) (pediatric): Secondary | ICD-10-CM | POA: Diagnosis not present

## 2022-06-14 DIAGNOSIS — G4733 Obstructive sleep apnea (adult) (pediatric): Secondary | ICD-10-CM | POA: Diagnosis not present

## 2022-07-14 DIAGNOSIS — G4733 Obstructive sleep apnea (adult) (pediatric): Secondary | ICD-10-CM | POA: Diagnosis not present

## 2022-07-23 ENCOUNTER — Other Ambulatory Visit: Payer: Self-pay | Admitting: Family Medicine

## 2022-07-23 DIAGNOSIS — E7849 Other hyperlipidemia: Secondary | ICD-10-CM

## 2022-08-02 DIAGNOSIS — G4733 Obstructive sleep apnea (adult) (pediatric): Secondary | ICD-10-CM | POA: Diagnosis not present

## 2022-08-09 DIAGNOSIS — M791 Myalgia, unspecified site: Secondary | ICD-10-CM | POA: Diagnosis not present

## 2022-08-09 DIAGNOSIS — I10 Essential (primary) hypertension: Secondary | ICD-10-CM | POA: Diagnosis not present

## 2022-08-09 DIAGNOSIS — E7800 Pure hypercholesterolemia, unspecified: Secondary | ICD-10-CM | POA: Diagnosis not present

## 2022-08-09 DIAGNOSIS — M5441 Lumbago with sciatica, right side: Secondary | ICD-10-CM | POA: Diagnosis not present

## 2022-08-09 DIAGNOSIS — G8929 Other chronic pain: Secondary | ICD-10-CM | POA: Diagnosis not present

## 2022-08-11 ENCOUNTER — Other Ambulatory Visit: Payer: Self-pay | Admitting: Family Medicine

## 2022-08-11 DIAGNOSIS — F32A Depression, unspecified: Secondary | ICD-10-CM

## 2022-08-13 ENCOUNTER — Other Ambulatory Visit: Payer: Self-pay | Admitting: Podiatry

## 2022-08-13 NOTE — Telephone Encounter (Signed)
Requested Prescriptions  Refused Prescriptions Disp Refills   venlafaxine XR (EFFEXOR-XR) 75 MG 24 hr capsule [Pharmacy Med Name: VENLAFAXINE HCL ER 75 MG CAP] 90 capsule 0    Sig: TAKE 1 CAPSULE BY MOUTH DAILY WITH BREAKFAST.     Psychiatry: Antidepressants - SNRI - desvenlafaxine & venlafaxine Failed - 08/11/2022  9:35 AM      Failed - Valid encounter within last 6 months    Recent Outpatient Visits           7 months ago Viral URI   [facility] Caro Laroche, DO   10 months ago Annual physical exam   [facility] Bosie Clos, MD   1 year ago Essential (primary) hypertension   [facility] Bosie Clos, MD   1 year ago Annual physical exam   [facility] Bosie Clos, MD   2 years ago Essential (primary) hypertension   [facility] Bosie Clos, MD              Failed - Lipid Panel in normal range within the last 12 months    Cholesterol, Total  Date Value Ref Range Status  09/27/2021 129 100 - 199 mg/dL Final   LDL Chol Calc (NIH)  Date Value Ref Range Status  09/27/2021 60 0 - 99 mg/dL Final   HDL  Date Value Ref Range Status  09/27/2021 56 >39 mg/dL Final   Triglycerides  Date Value Ref Range Status  09/27/2021 64 0 - 149 mg/dL Final   Triglyceride fasting, serum  Date Value Ref Range Status  04/26/2009 107 mg/dL          Passed - Cr in normal range and within 360 days    Creatinine, Ser  Date Value Ref Range Status  09/27/2021 1.00 0.76 - 1.27 mg/dL Final         Passed - Completed PHQ-2 or PHQ-9 in the last 360 days      Passed - Last BP in normal range    BP Readings from Last 1 Encounters:  01/08/22 116/84

## 2022-08-14 DIAGNOSIS — G4733 Obstructive sleep apnea (adult) (pediatric): Secondary | ICD-10-CM | POA: Diagnosis not present

## 2022-09-13 DIAGNOSIS — H25813 Combined forms of age-related cataract, bilateral: Secondary | ICD-10-CM | POA: Diagnosis not present

## 2022-09-25 ENCOUNTER — Encounter: Payer: BC Managed Care – PPO | Admitting: Family Medicine

## 2022-10-02 DIAGNOSIS — D509 Iron deficiency anemia, unspecified: Secondary | ICD-10-CM | POA: Diagnosis not present

## 2022-10-02 DIAGNOSIS — Z125 Encounter for screening for malignant neoplasm of prostate: Secondary | ICD-10-CM | POA: Diagnosis not present

## 2022-10-02 DIAGNOSIS — E7800 Pure hypercholesterolemia, unspecified: Secondary | ICD-10-CM | POA: Diagnosis not present

## 2022-10-02 DIAGNOSIS — I10 Essential (primary) hypertension: Secondary | ICD-10-CM | POA: Diagnosis not present

## 2022-10-02 DIAGNOSIS — G4733 Obstructive sleep apnea (adult) (pediatric): Secondary | ICD-10-CM | POA: Diagnosis not present

## 2022-10-02 DIAGNOSIS — Z0000 Encounter for general adult medical examination without abnormal findings: Secondary | ICD-10-CM | POA: Diagnosis not present

## 2022-10-19 ENCOUNTER — Other Ambulatory Visit: Payer: Self-pay | Admitting: Family Medicine

## 2022-10-19 DIAGNOSIS — Z136 Encounter for screening for cardiovascular disorders: Secondary | ICD-10-CM

## 2022-10-19 DIAGNOSIS — Z0000 Encounter for general adult medical examination without abnormal findings: Secondary | ICD-10-CM

## 2022-10-23 ENCOUNTER — Ambulatory Visit
Admission: RE | Admit: 2022-10-23 | Discharge: 2022-10-23 | Disposition: A | Discharge status: Home / Self Care | Payer: Self-pay | Source: Ambulatory Visit | Attending: Family Medicine | Admitting: Family Medicine

## 2022-10-23 DIAGNOSIS — Z136 Encounter for screening for cardiovascular disorders: Secondary | ICD-10-CM | POA: Insufficient documentation

## 2022-10-23 DIAGNOSIS — Z0000 Encounter for general adult medical examination without abnormal findings: Secondary | ICD-10-CM | POA: Insufficient documentation

## 2022-10-27 ENCOUNTER — Other Ambulatory Visit: Payer: Self-pay | Admitting: Family Medicine

## 2022-10-27 DIAGNOSIS — E7849 Other hyperlipidemia: Secondary | ICD-10-CM

## 2022-11-04 DIAGNOSIS — G4733 Obstructive sleep apnea (adult) (pediatric): Secondary | ICD-10-CM | POA: Diagnosis not present

## 2022-11-19 DIAGNOSIS — L820 Inflamed seborrheic keratosis: Secondary | ICD-10-CM | POA: Diagnosis not present

## 2022-11-19 DIAGNOSIS — L570 Actinic keratosis: Secondary | ICD-10-CM | POA: Diagnosis not present

## 2022-11-19 DIAGNOSIS — D225 Melanocytic nevi of trunk: Secondary | ICD-10-CM | POA: Diagnosis not present

## 2022-11-19 DIAGNOSIS — D2262 Melanocytic nevi of left upper limb, including shoulder: Secondary | ICD-10-CM | POA: Diagnosis not present

## 2022-11-19 DIAGNOSIS — X32XXXA Exposure to sunlight, initial encounter: Secondary | ICD-10-CM | POA: Diagnosis not present

## 2022-11-19 DIAGNOSIS — D485 Neoplasm of uncertain behavior of skin: Secondary | ICD-10-CM | POA: Diagnosis not present

## 2022-12-04 DIAGNOSIS — G4733 Obstructive sleep apnea (adult) (pediatric): Secondary | ICD-10-CM | POA: Diagnosis not present

## 2023-01-04 DIAGNOSIS — G4733 Obstructive sleep apnea (adult) (pediatric): Secondary | ICD-10-CM | POA: Diagnosis not present

## 2023-01-10 DIAGNOSIS — D509 Iron deficiency anemia, unspecified: Secondary | ICD-10-CM | POA: Diagnosis not present

## 2023-01-10 DIAGNOSIS — J069 Acute upper respiratory infection, unspecified: Secondary | ICD-10-CM | POA: Diagnosis not present

## 2023-01-10 DIAGNOSIS — I10 Essential (primary) hypertension: Secondary | ICD-10-CM | POA: Diagnosis not present

## 2023-01-10 DIAGNOSIS — Z23 Encounter for immunization: Secondary | ICD-10-CM | POA: Diagnosis not present

## 2023-01-10 DIAGNOSIS — E7800 Pure hypercholesterolemia, unspecified: Secondary | ICD-10-CM | POA: Diagnosis not present

## 2023-01-31 ENCOUNTER — Ambulatory Visit: Payer: BC Managed Care – PPO | Attending: Cardiovascular Disease | Admitting: Cardiovascular Disease

## 2023-01-31 ENCOUNTER — Encounter: Payer: Self-pay | Admitting: Cardiovascular Disease

## 2023-01-31 VITALS — BP 124/80 | HR 61 | Ht 75.0 in | Wt 214.5 lb

## 2023-01-31 DIAGNOSIS — I10 Essential (primary) hypertension: Secondary | ICD-10-CM

## 2023-01-31 DIAGNOSIS — I2510 Atherosclerotic heart disease of native coronary artery without angina pectoris: Secondary | ICD-10-CM | POA: Diagnosis not present

## 2023-01-31 DIAGNOSIS — E7849 Other hyperlipidemia: Secondary | ICD-10-CM

## 2023-01-31 MED ORDER — LOSARTAN POTASSIUM 100 MG PO TABS: 100.0000 mg | ORAL_TABLET | Freq: Every day | ORAL | 3 refills | Status: DC

## 2023-01-31 MED ORDER — ATORVASTATIN CALCIUM 40 MG PO TABS: 40.0000 mg | ORAL_TABLET | Freq: Every day | ORAL | 3 refills | Status: DC

## 2023-01-31 MED ORDER — EZETIMIBE 10 MG PO TABS: 10.0000 mg | ORAL_TABLET | Freq: Every day | ORAL | 3 refills | Status: DC

## 2023-01-31 NOTE — Patient Instructions (Signed)
Medication Instructions:  Pleas start  zetia 10 mg daily  If you need a refill on your cardiac medications before your next appointment, please call your pharmacy.   Lab work: No new labs needed  Testing/Procedures: No new testing needed  Follow-Up: At [facility], you and your health needs are our priority.  As part of our continuing mission to provide you with exceptional heart care, we have created designated Provider Care Teams.  These Care Teams include your primary Cardiologist (physician) and Advanced Practice Providers (APPs -  Physician Assistants and Nurse Practitioners) who all work together to provide you with the care you need, when you need it.  You will need a follow up appointment in 12 months  Providers on your designated Care Team:   Nicolasa Ducking, NP Eula Listen, PA-C Cadence Fransico Michael, [place]  COVID-19 Vaccine Information can be found at: [email or web address] For questions related to vaccine distribution or appointments, please email [email or web address] or call [phone].

## 2023-01-31 NOTE — Progress Notes (Signed)
Cardiology Office Note  Date:  01/31/2023   ID:  Zachary Hess, DOB 12/20/1960, MRN [id number]  PCP:  Bosie Clos, MD   Chief Complaint  Patient presents with   New Patient (Initial Visit)    Establish care for ASCVD. Former Dr. Clifton Hess patient. Medications reviewed by the patient verbally.       HPI:  [name] with history of  HTN,  hyperlipidemia  Coronary calcification Who presents by referral from Dr. Sullivan Lone for consultation of his coronary calcification  Seen by Dr. Sullivan Lone, given strong family history of CAD, CT coronary calcium score ordered  Denies chest pain, SOB, palpitations, fatigue, near syncope, syncope, orthopnea, PND or lower extremity edema. He does not smoke.  He exercises 3-4 days per week and plays tennis and basketball.   Active works out with a trainer 3-4 x a week: Yard work  Blood pressure well-controlled at home  8/24 CT coronary calcium score Coronary calcium score of 398. This was 81st percentile for age and sex matched control.  Lab work reviewed Total cholesterol 159 LDL 70 Tolerating Lipitor 40 daily  EKG personally reviewed by myself on todays visit EKG Interpretation Date/Time:  Thursday January 31 2023 11:05:02 EST Ventricular Rate:  61 PR Interval:  194 QRS Duration:  92 QT Interval:  408 QTC Calculation: 410 R Axis:   -1  Text Interpretation: Normal sinus rhythm Normal ECG No previous ECGs available Confirmed by Julien Nordmann [phone]) on 01/31/2023 11:34:10 AM    PMH:   has a past medical history of Anxiety, HBP (high blood pressure), and Sleep apnea (01/03/2017).  PSH:    Past Surgical History:  Procedure Laterality Date   ANAL FISSURE REPAIR     COLONOSCOPY WITH PROPOFOL N/A 10/07/2018   Procedure: COLONOSCOPY WITH PROPOFOL;  Surgeon: Wyline Mood, MD;  Location: [facility] ENDOSCOPY;  Service: Gastroenterology;  Laterality: N/A;   COLONOSCOPY WITH PROPOFOL N/A 12/08/2021   Procedure: COLONOSCOPY WITH PROPOFOL;  Surgeon:  Wyline Mood, MD;  Location: [facility] ENDOSCOPY;  Service: Gastroenterology;  Laterality: N/A;   FOOT SURGERY Left    MANDIBLE FRACTURE SURGERY     MOUTH SURGERY     WISDOM TEETH    Current Outpatient Medications  Medication Sig Dispense Refill   Ascorbic Acid (VITAMIN C) 1000 MG tablet Take 500 mg by mouth daily.     aspirin 81 MG tablet Take by mouth.     atorvastatin (LIPITOR) 40 MG tablet Take 1 tablet (40 mg total) by mouth at bedtime. Please schedule office visit before any future refill. 90 tablet 0   Cholecalciferol 1000 UNITS tablet Take by mouth.     Coenzyme Q10 (COQ10) 100 MG CAPS Take 200 mg by mouth 2 (two) times daily.     cyclobenzaprine (FLEXERIL) 10 MG tablet Take 10 mg by mouth 3 (three) times daily as needed.     fluticasone (FLONASE) 50 MCG/ACT nasal spray Place into the nose.      Loratadine (CLARITIN PO) Take by mouth daily.     losartan (COZAAR) 100 MG tablet TAKE 1 TABLET BY MOUTH EVERY DAY 90 tablet 3   LYSINE PO Take by mouth.     Multiple Vitamins-Minerals (ZINC PO) Take by mouth.     Triamcinolone Acetonide (NASACORT AQ NA) Place into the nose.     triamcinolone cream (KENALOG) 0.1 %      venlafaxine XR (EFFEXOR-XR) 75 MG 24 hr capsule TAKE 1 CAPSULE BY MOUTH DAILY WITH BREAKFAST. 90 capsule 0  No current facility-administered medications for this visit.    Allergies:   Patient has no known allergies.   Social History:  The patient  reports that he has never smoked. He has never used smokeless tobacco. He reports current alcohol use of about 2.0 standard drinks of alcohol per week. He reports that he does not use drugs.   Family History:   family history includes Atrial fibrillation in his mother; CAD in his father; Congestive Heart Failure in his father; Diabetes in his mother; Heart attack in his paternal uncle; Heart attack (age of onset: 7) in his father; Heart disease in his maternal grandmother; Stroke in his paternal grandfather.    Review of  Systems: Review of Systems  Constitutional: Negative.   HENT: Negative.    Respiratory: Negative.    Cardiovascular: Negative.   Gastrointestinal: Negative.   Musculoskeletal: Negative.   Neurological: Negative.   Psychiatric/Behavioral: Negative.    All other systems reviewed and are negative.   PHYSICAL EXAM: VS:  BP 124/80 (BP Location: Left Arm, Patient Position: Sitting, Cuff Size: Normal)   Pulse 61   Ht 6\' 3"  (1.905 m)   Wt 214 lb 8 oz (97.3 kg)   SpO2 98%   BMI 26.81 kg/m  , BMI Body mass index is 26.81 kg/m. GEN: Well nourished, well developed, in no acute distress HEENT: normal Neck: no JVD, carotid bruits, or masses Cardiac: RRR; no murmurs, rubs, or gallops,no edema  Respiratory:  clear to auscultation bilaterally, normal work of breathing GI: soft, nontender, nondistended, + BS MS: no deformity or atrophy Skin: warm and dry, no rash Neuro:  Strength and sensation are intact Psych: euthymic mood, full affect   Recent Labs: No results found for requested labs within last 365 days.    Lipid Panel Lab Results  Component Value Date   CHOL 129 09/27/2021   HDL 56 09/27/2021   LDLCALC 60 09/27/2021   TRIG 64 09/27/2021      Wt Readings from Last 3 Encounters:  01/31/23 214 lb 8 oz (97.3 kg)  01/08/22 210 lb (95.3 kg)  12/08/21 200 lb (90.7 kg)       ASSESSMENT AND PLAN:  Problem List Items Addressed This Visit       Cardiology Problems   Essential (primary) hypertension   Relevant Orders   EKG 12-Lead (Completed)   HYPERTENSION, BENIGN - Primary   Other Visit Diagnoses     Arteriosclerotic cardiovascular disease       Relevant Orders   EKG 12-Lead (Completed)      Coronary calcification Calcium score 398, images pulled up and reviewed in detail Denies chest pain or other symptoms concerning for angina Active, works out with a trainer 3 to 4 days a week Cholesterol slightly above goal, weight has been trending upwards past  year Recommend he continue Lipitor 40 daily we will add Zetia 10 mg daily to achieve goal LDL less than 55 No additional workup needed at this time Discussed anginal symptoms to watch for  Essential hypertension Blood pressure is well controlled on today's visit. No changes made to the medications.  Patient seen in consultation for Dr. Sullivan Lone and will be referred back to his office for ongoing care of the issues detailed above  Signed, Dossie Arbour, M.D., Ph.D. [facility] [place], [place] [phone]

## 2023-02-05 DIAGNOSIS — G4733 Obstructive sleep apnea (adult) (pediatric): Secondary | ICD-10-CM | POA: Diagnosis not present

## 2023-04-05 ENCOUNTER — Ambulatory Visit: Payer: BC Managed Care – PPO | Admitting: Cardiovascular Disease

## 2023-04-09 ENCOUNTER — Ambulatory Visit: Payer: BC Managed Care – PPO | Admitting: Cardiovascular Disease

## 2024-01-19 ENCOUNTER — Other Ambulatory Visit: Payer: Self-pay | Admitting: Cardiovascular Disease

## 2024-01-21 ENCOUNTER — Telehealth: Payer: Self-pay | Admitting: Cardiovascular Disease

## 2024-01-21 MED ORDER — EZETIMIBE 10 MG PO TABS: 10.0000 mg | ORAL_TABLET | Freq: Every day | ORAL | 0 refills | Status: DC

## 2024-01-21 NOTE — Telephone Encounter (Signed)
*  STAT* If patient is at the pharmacy, call can be transferred to refill team.   1. Which medications need to be refilled? (please list name of each medication and dose if known) ezetimibe  (ZETIA ) 10 MG tablet (Expired)    2. Would you like to learn more about the convenience, safety, & potential cost savings by using the [facility]? No    3. Are you open to using the [facility] (Type [facility]. No    4. Which pharmacy/location (including street and city if local pharmacy) is medication to be sent to?[id number] [address], [place] [phone] [address]    5. Do they need a 30 day or 90 day supply? 90 day   Pt scheduled 03/16/24  Pt is out of medication

## 2024-01-21 NOTE — Telephone Encounter (Signed)
 Refill sent, appt scheduled 03/16/24.

## 2024-02-13 ENCOUNTER — Other Ambulatory Visit: Payer: Self-pay | Admitting: Cardiovascular Disease

## 2024-02-13 DIAGNOSIS — E7849 Other hyperlipidemia: Secondary | ICD-10-CM

## 2024-02-13 DIAGNOSIS — I10 Essential (primary) hypertension: Secondary | ICD-10-CM

## 2024-03-02 ENCOUNTER — Other Ambulatory Visit: Payer: Self-pay

## 2024-03-02 ENCOUNTER — Emergency Department (HOSPITAL_BASED_OUTPATIENT_CLINIC_OR_DEPARTMENT_OTHER)

## 2024-03-02 ENCOUNTER — Encounter (HOSPITAL_BASED_OUTPATIENT_CLINIC_OR_DEPARTMENT_OTHER): Payer: Self-pay | Admitting: *Deleted

## 2024-03-02 DIAGNOSIS — Z7982 Long term (current) use of aspirin: Secondary | ICD-10-CM | POA: Diagnosis not present

## 2024-03-02 DIAGNOSIS — S0990XA Unspecified injury of head, initial encounter: Secondary | ICD-10-CM | POA: Diagnosis present

## 2024-03-02 DIAGNOSIS — Y9301 Activity, walking, marching and hiking: Secondary | ICD-10-CM | POA: Diagnosis not present

## 2024-03-02 DIAGNOSIS — Y92094 Garage of other non-institutional residence as the place of occurrence of the external cause: Secondary | ICD-10-CM | POA: Diagnosis not present

## 2024-03-02 DIAGNOSIS — M542 Cervicalgia: Secondary | ICD-10-CM | POA: Insufficient documentation

## 2024-03-02 DIAGNOSIS — W228XXA Striking against or struck by other objects, initial encounter: Secondary | ICD-10-CM | POA: Insufficient documentation
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
## 2024-03-03 ENCOUNTER — Emergency Department (HOSPITAL_BASED_OUTPATIENT_CLINIC_OR_DEPARTMENT_OTHER)
Admission: EM | Admit: 2024-03-03 | Discharge: 2024-03-03 | Disposition: A | Discharge status: Home / Self Care | Attending: Emergency Medicine | Admitting: Emergency Medicine

## 2024-03-03 DIAGNOSIS — M542 Cervicalgia: Secondary | ICD-10-CM

## 2024-03-03 DIAGNOSIS — S0990XA Unspecified injury of head, initial encounter: Secondary | ICD-10-CM

## 2024-03-03 MED ORDER — NAPROXEN 500 MG PO TABS: 500.0000 mg | ORAL_TABLET | Freq: Two times a day (BID) | ORAL | 0 refills | Status: AC

## 2024-03-03 MED ORDER — KETOROLAC TROMETHAMINE 30 MG/ML IJ SOLN
15.0000 mg | Freq: Once | INTRAMUSCULAR | Status: AC
  Administered 2024-03-03: 15 mg via INTRAMUSCULAR
  Filled 2024-03-03: qty 1

## 2024-03-03 MED ORDER — METHOCARBAMOL 500 MG PO TABS: 500.0000 mg | ORAL_TABLET | Freq: Two times a day (BID) | ORAL | 0 refills | Status: AC

## 2024-03-03 MED ORDER — DIAZEPAM 2 MG PO TABS
2.0000 mg | ORAL_TABLET | Freq: Once | ORAL | Status: AC
  Administered 2024-03-03: 2 mg via ORAL
  Filled 2024-03-03: qty 1

## 2024-03-03 MED ORDER — METHOCARBAMOL 500 MG PO TABS
500.0000 mg | ORAL_TABLET | Freq: Once | ORAL | Status: AC
  Administered 2024-03-03: 500 mg via ORAL
  Filled 2024-03-03: qty 1
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
## 2024-03-13 DIAGNOSIS — I2510 Atherosclerotic heart disease of native coronary artery without angina pectoris: Secondary | ICD-10-CM | POA: Insufficient documentation

## 2024-03-13 NOTE — Progress Notes (Unsigned)
 Cardiology Office Note  Date:  03/16/2024   ID:  Zachary Hess, DOB Sep 17, 1960, MRN [id number]  PCP:  Bertrum Charlie CROME, MD   Chief Complaint  Patient presents with   12 month follow up     Doing well.     HPI:  Zachary Hess is a 64 yo WM with history of  HTN,  hyperlipidemia  Coronary calcium  score of 398 in 8/24 Who presents for follow-up of his coronary calcification  LOV 12/24 In follow-up today reports doing well Continues to work, thinks he will work 2 or more years Continues to workout with a trainer, plays tennis  active at baseline, yard work Denies chest pain or shortness of breath on exertion  does not smoke.  No diabetes He exercises 3-4 days per week and plays tennis and basketball.   Blood pressure well-controlled at home  8/24 CT coronary calcium  score Coronary calcium  score of 398. This was 81st percentile for age and sex matched control.  Lab work reviewed Total cholesterol 121 LDL 48 Tolerating Lipitor 40 daily, zetia  10 daily  EKG personally reviewed by myself on todays visit EKG Interpretation Date/Time:  Monday March 16 2024 16:38:51 EST Ventricular Rate:  63 PR Interval:  208 QRS Duration:  96 QT Interval:  398 QTC Calculation: 407 R Axis:   -3  Text Interpretation: Normal sinus rhythm Normal ECG When compared with ECG of 31-Jan-2023 11:05, No significant change was found Confirmed by Perla Lye [phone]) on 03/16/2024 5:05:54 PM    PMH:   has a past medical history of Anxiety, HBP (high blood pressure), and Sleep apnea (01/03/2017).  PSH:    Past Surgical History:  Procedure Laterality Date   ANAL FISSURE REPAIR     COLONOSCOPY WITH PROPOFOL  N/A 10/07/2018   Procedure: COLONOSCOPY WITH PROPOFOL ;  Surgeon: Therisa Bi, MD;  Location: [facility] ENDOSCOPY;  Service: Gastroenterology;  Laterality: N/A;   COLONOSCOPY WITH PROPOFOL  N/A 12/08/2021   Procedure: COLONOSCOPY WITH PROPOFOL ;  Surgeon: Therisa Bi, MD;  Location: [facility] ENDOSCOPY;   Service: Gastroenterology;  Laterality: N/A;   FOOT SURGERY Left    MANDIBLE FRACTURE SURGERY     MOUTH SURGERY     WISDOM TEETH    Current Outpatient Medications  Medication Sig Dispense Refill   Ascorbic Acid (VITAMIN C) 1000 MG tablet Take 500 mg by mouth daily.     aspirin 81 MG tablet Take by mouth.     atorvastatin  (LIPITOR) 40 MG tablet TAKE 1 TABLET BY MOUTH EVERYDAY AT BEDTIME 90 tablet 0   Cholecalciferol 1000 UNITS tablet Take by mouth.     Coenzyme Q10 (COQ10) 100 MG CAPS Take 200 mg by mouth 2 (two) times daily.     cyclobenzaprine (FLEXERIL) 10 MG tablet Take 10 mg by mouth 3 (three) times daily as needed.     ezetimibe  (ZETIA ) 10 MG tablet Take 1 tablet (10 mg total) by mouth daily. 90 tablet 0   fluticasone (FLONASE) 50 MCG/ACT nasal spray Place into the nose.      Loratadine (CLARITIN PO) Take by mouth daily.     losartan  (COZAAR ) 100 MG tablet TAKE 1 TABLET BY MOUTH EVERY DAY 90 tablet 0   LYSINE PO Take by mouth.     methocarbamol  (ROBAXIN ) 500 MG tablet Take 1 tablet (500 mg total) by mouth 2 (two) times daily. 20 tablet 0   Multiple Vitamins-Minerals (ZINC PO) Take by mouth.     naproxen  (NAPROSYN ) 500 MG tablet Take 1 tablet (500  mg total) by mouth 2 (two) times daily. 30 tablet 0   Triamcinolone Acetonide (NASACORT AQ NA) Place into the nose.     triamcinolone cream (KENALOG) 0.1 %      venlafaxine  XR (EFFEXOR -XR) 75 MG 24 hr capsule TAKE 1 CAPSULE BY MOUTH DAILY WITH BREAKFAST. 90 capsule 0   No current facility-administered medications for this visit.    Allergies:   Patient has no known allergies.   Social History:  The patient  reports that he has never smoked. He has never used smokeless tobacco. He reports current alcohol use of about 2.0 standard drinks of alcohol per week. He reports that he does not use drugs.   Family History:   family history includes Atrial fibrillation in his mother; CAD in his father; Congestive Heart Failure in his father;  Diabetes in his mother; Heart attack in his paternal uncle; Heart attack (age of onset: 75) in his father; Heart disease in his maternal grandmother; Stroke in his paternal grandfather.    Review of Systems: Review of Systems  Constitutional: Negative.   HENT: Negative.    Respiratory: Negative.    Cardiovascular: Negative.   Gastrointestinal: Negative.   Musculoskeletal: Negative.   Neurological: Negative.   Psychiatric/Behavioral: Negative.    All other systems reviewed and are negative.   PHYSICAL EXAM: VS:  BP 120/70 (BP Location: Left Arm, Patient Position: Sitting, Cuff Size: Normal)   Pulse 63   Ht 6' 3 (1.905 m)   Wt 220 lb (99.8 kg)   SpO2 95%   BMI 27.50 kg/m  , BMI Body mass index is 27.5 kg/m. Constitutional:  oriented to person, place, and time. No distress.  HENT:  Head: Grossly normal Eyes:  no discharge. No scleral icterus.  Neck: No JVD, no carotid bruits  Cardiovascular: Regular rate and rhythm, no murmurs appreciated Pulmonary/Chest: Clear to auscultation bilaterally, no wheezes or rales Abdominal: Soft.  no distension.  no tenderness.  Musculoskeletal: Normal range of motion Neurological:  normal muscle tone. Coordination normal. No atrophy Skin: Skin warm and dry Psychiatric: normal affect, pleasant   Recent Labs: No results found for requested labs within last 365 days.    Lipid Panel Lab Results  Component Value Date   CHOL 129 09/27/2021   HDL 56 09/27/2021   LDLCALC 60 09/27/2021   TRIG 64 09/27/2021    Wt Readings from Last 3 Encounters:  03/16/24 220 lb (99.8 kg)  01/31/23 214 lb 8 oz (97.3 kg)  01/08/22 210 lb (95.3 kg)     ASSESSMENT AND PLAN:  Problem List Items Addressed This Visit       Cardiology Problems   Coronary artery calcification - Primary   Relevant Orders   EKG 12-Lead (Completed)   Essential (primary) hypertension   Relevant Orders   EKG 12-Lead (Completed)   HLD (hyperlipidemia)     Other   Sleep  apnea   Coronary calcification Calcium  score 398, Currently with no symptoms of angina. No further workup at this time. Continue current medication regimen.  Hyperlipidemia continue Lipitor 40 daily Zetia  10 mg daily  Cholesterol at goal  Essential hypertension Blood pressure is well controlled on today's visit. No changes made to the medications.   Signed, Velinda Lunger, M.D., Ph.D. [facility] [place], [place] [phone]

## 2024-03-16 ENCOUNTER — Ambulatory Visit: Attending: Cardiovascular Disease | Admitting: Cardiovascular Disease

## 2024-03-16 ENCOUNTER — Encounter: Payer: Self-pay | Admitting: Cardiovascular Disease

## 2024-03-16 VITALS — BP 120/70 | HR 63 | Ht 75.0 in | Wt 220.0 lb

## 2024-03-16 DIAGNOSIS — G4733 Obstructive sleep apnea (adult) (pediatric): Secondary | ICD-10-CM

## 2024-03-16 DIAGNOSIS — I2510 Atherosclerotic heart disease of native coronary artery without angina pectoris: Secondary | ICD-10-CM

## 2024-03-16 DIAGNOSIS — I10 Essential (primary) hypertension: Secondary | ICD-10-CM

## 2024-03-16 DIAGNOSIS — E782 Mixed hyperlipidemia: Secondary | ICD-10-CM

## 2024-03-16 DIAGNOSIS — E7849 Other hyperlipidemia: Secondary | ICD-10-CM | POA: Diagnosis not present

## 2024-03-16 MED ORDER — ATORVASTATIN CALCIUM 40 MG PO TABS: 40.0000 mg | ORAL_TABLET | Freq: Every day | ORAL | 4 refills | Status: AC

## 2024-03-16 MED ORDER — LOSARTAN POTASSIUM 100 MG PO TABS: 100.0000 mg | ORAL_TABLET | Freq: Every day | ORAL | 4 refills | Status: AC

## 2024-03-16 MED ORDER — EZETIMIBE 10 MG PO TABS: 10.0000 mg | ORAL_TABLET | Freq: Every day | ORAL | 4 refills | Status: AC

## 2024-03-16 NOTE — Patient Instructions (Signed)
